# Patient Record
Sex: Female | Born: 1980 | Race: Black or African American | Hispanic: No | Marital: Married | Smoking: Former smoker
Health system: Southern US, Community
[De-identification: ages and names within clinical notes are randomized; demographics above are authoritative.]

## PROBLEM LIST (undated history)

## (undated) DIAGNOSIS — E079 Disorder of thyroid, unspecified: Secondary | ICD-10-CM

## (undated) DIAGNOSIS — M25562 Pain in left knee: Secondary | ICD-10-CM

## (undated) DIAGNOSIS — M25561 Pain in right knee: Secondary | ICD-10-CM

## (undated) DIAGNOSIS — F419 Anxiety disorder, unspecified: Secondary | ICD-10-CM

## (undated) DIAGNOSIS — G8929 Other chronic pain: Secondary | ICD-10-CM

---

## 2005-01-22 HISTORY — PX: THYROIDECTOMY: SHX17

## 2005-10-02 ENCOUNTER — Ambulatory Visit: Payer: Self-pay | Admitting: Family Medicine

## 2005-10-05 ENCOUNTER — Other Ambulatory Visit: Admission: RE | Admit: 2005-10-05 | Discharge: 2005-10-05 | Payer: Self-pay | Admitting: Obstetrics and Gynecology

## 2005-11-12 ENCOUNTER — Encounter: Admission: RE | Admit: 2005-11-12 | Discharge: 2005-11-12 | Payer: Self-pay | Admitting: Endocrinology

## 2005-11-12 ENCOUNTER — Other Ambulatory Visit: Admission: RE | Admit: 2005-11-12 | Discharge: 2005-11-12 | Payer: Self-pay | Admitting: Interventional Radiology

## 2011-06-12 ENCOUNTER — Emergency Department (HOSPITAL_COMMUNITY)
Admission: EM | Admit: 2011-06-12 | Discharge: 2011-06-12 | Disposition: A | Discharge status: Home / Self Care | Payer: Self-pay | Attending: Emergency Medicine | Admitting: Emergency Medicine

## 2011-06-12 ENCOUNTER — Encounter (HOSPITAL_COMMUNITY): Payer: Self-pay | Admitting: Emergency Medicine

## 2011-06-12 ENCOUNTER — Emergency Department (HOSPITAL_COMMUNITY): Payer: Self-pay

## 2011-06-12 DIAGNOSIS — M25539 Pain in unspecified wrist: Secondary | ICD-10-CM | POA: Insufficient documentation

## 2011-06-12 DIAGNOSIS — F17200 Nicotine dependence, unspecified, uncomplicated: Secondary | ICD-10-CM | POA: Insufficient documentation

## 2011-06-12 DIAGNOSIS — M6740 Ganglion, unspecified site: Secondary | ICD-10-CM | POA: Insufficient documentation

## 2011-06-12 DIAGNOSIS — M67439 Ganglion, unspecified wrist: Secondary | ICD-10-CM

## 2011-06-12 MED ORDER — NAPROXEN 500 MG PO TABS: 500.0000 mg | ORAL_TABLET | Freq: Two times a day (BID) | ORAL | Status: DC

## 2011-06-12 MED ORDER — TRAMADOL HCL 50 MG PO TABS: 50.0000 mg | ORAL_TABLET | Freq: Four times a day (QID) | ORAL | Status: AC | PRN

## 2011-06-12 MED ORDER — NAPROXEN 500 MG PO TABS
500.0000 mg | ORAL_TABLET | Freq: Once | ORAL | Status: AC
  Administered 2011-06-12: 500 mg via ORAL
  Filled 2011-06-12: qty 1

## 2011-06-12 NOTE — ED Notes (Signed)
PT reports pain unchanged. MD aware.

## 2011-06-12 NOTE — ED Notes (Signed)
PT. REPORTS RIGHT WRIST PAIN FOR 2 MONTHS , DENIES RECENT INJURY .

## 2011-06-12 NOTE — ED Notes (Signed)
PT ambulated with a  Steady gait; VSS; A&Ox3; no signs of distress; respirations even and unlabored; skin warm and dry; pt verbalizes understanding of d/c instructions.

## 2011-06-12 NOTE — ED Notes (Signed)
Patient transported to X-ray 
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
## 2011-06-12 NOTE — Discharge Instructions (Signed)
The swelling of your wrist is a ganglion cyst. Please see the reading instructions below regarding this diagnosis. Take Naprosyn twice a day, followup with the hand surgeon listed above as needed. This does not need to be cut off for removed as they usually go away by themselves.  Return immediately if her symptoms become severe such as redness, fevers, severe pain or swelling.  Ganglion A ganglion is a swelling under the skin that is filled with a thick, jelly-like substance. It is a synovial cyst. This is caused by a break (rupture) of the joint lining from the joint space. A ganglion often occurs near an area of repeated minor trauma (damage caused by an accident). Trauma may also be a repetitive movement at work or in a sport. TREATMENT  It often goes away without treatment. It may reappear later. Sometimes a ganglion may need to be surgically removed. Often they are drained and injected with a steroid. Sometimes they respond to:  Rest.   Splinting.  HOME CARE INSTRUCTIONS   Your caregiver will decide the best way of treating your ganglion. Do not try to break the ganglion yourself by pressing on it, poking it with a needle, or hitting it with a heavy object.   Use medications as directed.  SEEK MEDICAL CARE IF:   The ganglion becomes larger or more painful.   You have increased redness or swelling.   You have weakness or numbness in your hand or wrist.  MAKE SURE YOU:   Understand these instructions.   Will watch your condition.   Will get help right away if you are not doing well or get worse.  Document Released: 01/06/2000 Document Revised: 12/28/2010 Document Reviewed: 03/04/2007 [facility] Patient Information 2012 [place], [place].

## 2011-11-07 ENCOUNTER — Emergency Department (HOSPITAL_COMMUNITY)
Admission: EM | Admit: 2011-11-07 | Discharge: 2011-11-07 | Disposition: A | Discharge status: Home / Self Care | Payer: Self-pay | Attending: Emergency Medicine | Admitting: Emergency Medicine

## 2011-11-07 DIAGNOSIS — K0889 Other specified disorders of teeth and supporting structures: Secondary | ICD-10-CM

## 2011-11-07 DIAGNOSIS — K0381 Cracked tooth: Secondary | ICD-10-CM | POA: Insufficient documentation

## 2011-11-07 DIAGNOSIS — F17200 Nicotine dependence, unspecified, uncomplicated: Secondary | ICD-10-CM | POA: Insufficient documentation

## 2011-11-07 MED ORDER — OXYCODONE-ACETAMINOPHEN 5-325 MG PO TABS: ORAL_TABLET | ORAL | Status: DC

## 2011-11-07 MED ORDER — PENICILLIN V POTASSIUM 500 MG PO TABS: 1000.0000 mg | ORAL_TABLET | Freq: Two times a day (BID) | ORAL | Status: DC

## 2011-11-07 NOTE — ED Provider Notes (Signed)
History     CSN: [id number]  Arrival date & time 11/07/11  4696   First MD Initiated Contact with Patient 11/07/11 1034      Chief Complaint  Patient presents with  . Dental Pain    (Consider location/radiation/quality/duration/timing/severity/associated sxs/prior treatment) HPI  Regina Robertson is a 31 y.o. female complaining of left lower jaw tooth pain starting last night. Pain is rated at 8/10. She is afebrile. Denies nausea or vomiting.  No past medical history on file.  Past Surgical History  Procedure Date  . Thyroidectomy     No family history on file.  History  Substance Use Topics  . Smoking status: Current Every Day Smoker  . Smokeless tobacco: Not on file  . Alcohol Use: Yes    OB History    Grav Para Term Preterm Abortions TAB SAB Ect Mult Living                  Review of Systems  Constitutional: Negative for fever.  HENT: Positive for dental problem.   Respiratory: Negative for shortness of breath.   Cardiovascular: Negative for chest pain.  Gastrointestinal: Negative for nausea, vomiting, abdominal pain and diarrhea.  All other systems reviewed and are negative.    Allergies  Review of patient's allergies indicates no known allergies.  Home Medications  No current outpatient prescriptions on file.  BP 134/87  Pulse 84  Temp 98.9 F (37.2 C) (Oral)  Resp 16  SpO2 98%  LMP 10/14/2011  Physical Exam  Nursing note and vitals reviewed. Constitutional: She is oriented to person, place, and time. She appears well-developed and well-nourished. No distress.  HENT:  Head: Normocephalic.  Mouth/Throat: Oropharynx is clear and moist.       Cracked tooth to left lower jaw. No signs of active infection  Eyes: Conjunctivae normal and EOM are normal. Pupils are equal, round, and reactive to light.  Cardiovascular: Normal rate.   Pulmonary/Chest: Effort normal. No stridor.  Musculoskeletal: Normal range of motion.  Neurological: She is  alert and oriented to person, place, and time.  Psychiatric: She has a normal mood and affect.    ED Course  Procedures (including critical care time)  Labs Reviewed - No data to display No results found.   1. Pain, dental       MDM  Uncomplicated dental pain I will give her penicillin preventatively. Have given her dental followup  New Prescriptions   OXYCODONE-ACETAMINOPHEN (PERCOCET/ROXICET) 5-325 MG PER TABLET    1 to 2 tabs PO q6hrs  PRN for pain   PENICILLIN V POTASSIUM (VEETID) 500 MG TABLET    Take 2 tablets (1,000 mg total) by mouth 2 (two) times daily. X 7 days          Wynetta Emery, PA-C 11/07/11 1230

## 2011-11-07 NOTE — ED Notes (Addendum)
Pt c/o 10/10 pain at lower back molar on left side of mouth. Tooth appears broken, no carries or swelling visible, airway intact. Pt A&Ox4. Pt ambulates independently. Pt states she has no insurance and has not seen dentist in 2 years.

## 2011-11-07 NOTE — ED Provider Notes (Signed)
Medical screening examination/treatment/procedure(s) were performed by non-physician practitioner and as supervising physician I was immediately available for consultation/collaboration.  Lennon Boutwell, MD 11/07/11 1654 

## 2011-11-07 NOTE — ED Notes (Signed)
Per pt woke up with left sided dental pain and swelling.

## 2013-03-24 ENCOUNTER — Emergency Department (HOSPITAL_COMMUNITY)
Admission: EM | Admit: 2013-03-24 | Discharge: 2013-03-24 | Disposition: A | Discharge status: Home / Self Care | Payer: Self-pay | Attending: Emergency Medicine | Admitting: Emergency Medicine

## 2013-03-24 ENCOUNTER — Encounter (HOSPITAL_COMMUNITY): Payer: Self-pay | Admitting: Emergency Medicine

## 2013-03-24 DIAGNOSIS — M25569 Pain in unspecified knee: Secondary | ICD-10-CM | POA: Insufficient documentation

## 2013-03-24 DIAGNOSIS — M545 Low back pain, unspecified: Secondary | ICD-10-CM | POA: Insufficient documentation

## 2013-03-24 DIAGNOSIS — G8929 Other chronic pain: Secondary | ICD-10-CM

## 2013-03-24 DIAGNOSIS — Z792 Long term (current) use of antibiotics: Secondary | ICD-10-CM | POA: Insufficient documentation

## 2013-03-24 DIAGNOSIS — Z87891 Personal history of nicotine dependence: Secondary | ICD-10-CM | POA: Insufficient documentation

## 2013-03-24 DIAGNOSIS — G8921 Chronic pain due to trauma: Secondary | ICD-10-CM | POA: Insufficient documentation

## 2013-03-24 MED ORDER — CYCLOBENZAPRINE HCL 10 MG PO TABS: 10.0000 mg | ORAL_TABLET | Freq: Two times a day (BID) | ORAL | Status: DC | PRN

## 2013-03-24 MED ORDER — IBUPROFEN 800 MG PO TABS: 800.0000 mg | ORAL_TABLET | Freq: Three times a day (TID) | ORAL | Status: DC

## 2013-03-24 NOTE — ED Provider Notes (Signed)
Medical screening examination/treatment/procedure(s) were performed by non-physician practitioner and as supervising physician I was immediately available for consultation/collaboration.   Toy BakerAnthony T Jyquan Kenley, MD 03/24/13 [phone]

## 2013-03-24 NOTE — Discharge Instructions (Signed)
Arthralgia  Arthralgia is joint pain. A joint is a place where two bones meet. Joint pain can happen for many reasons. The joint can be bruised, stiff, infected, or weak from aging. Pain usually goes away after resting and taking medicine for soreness.   HOME CARE  · Rest the joint as told by your doctor.  · Keep the sore joint raised (elevated) for the first 24 hours.  · Put ice on the joint area.  · Put ice in a plastic bag.  · Place a towel between your skin and the bag.  · Leave the ice on for 15-20 minutes, 03-04 times a day.  · Wear your splint, casting, elastic bandage, or sling as told by your doctor.  · Only take medicine as told by your doctor. Do not take aspirin.  · Use crutches as told by your doctor. Do not put weight on the joint until told to by your doctor.  GET HELP RIGHT AWAY IF:   · You have bruising, puffiness (swelling), or more pain.  · Your fingers or toes turn blue or start to lose feeling (numb).  · Your medicine does not lessen the pain.  · Your pain becomes severe.  · You have a temperature by mouth above 102° F (38.9° C), not controlled by medicine.  · You cannot move or use the joint.  MAKE SURE YOU:   · Understand these instructions.  · Will watch your condition.  · Will get help right away if you are not doing well or get worse.  Document Released: 12/27/2008 Document Revised: 04/02/2011 Document Reviewed: 12/27/2008  [employer]® Patient Information ©2014 [employer].

## 2013-03-24 NOTE — ED Notes (Signed)
Pt stated that she fell in December. Both knees continue to cause pain, l/knee - unable to bear weight. C/o spasm sensation in l/knee. Unable to be seen by ortho and complete MRI, as recommended, due to financial concerns. Pt stated that she was seen in [place] twice, and treated for acute pain.

## 2013-03-24 NOTE — ED Notes (Signed)
Pt was very upset that the prescriptions that were presented to her were not a narcotic. Pt threw prescriptions in the trash can. Retreived by RN and shredded Pt needed to go to cancer center to be with her father. Pt was was escorted to cancer center ,via wheelchair, by this RN. Pt was very upset that "nothing was done"today. Pt threw discharge/follow up instructions in the trash. Retrieved by this RN. Pt reminded that her referral was on the paper. Pt asked for her papers back.

## 2013-03-24 NOTE — ED Provider Notes (Signed)
CSN: [id number]     Arrival date & time 03/24/13  1202 History  This chart was scribed for Regina Anis, PA working with Toy Baker, MD by Quintella Reichert, ED Scribe. This patient was seen in room WTR6/WTR6 and the patient's care was started at 12:21 PM.   Chief Complaint  Patient presents with  . Knee Pain    The history is provided by the patient. No language interpreter was used.    HPI Comments: Regina Robertson is a 33 y.o. female who presents to the Emergency Department complaining of 3 months of bilateral knee pain.  Pt reports that her pain began on 12/23/12 after she slipped and fell with onto both knees.  She denies injury to any other area in that fall.  Shortly after the fall she was seen for her knee pain at [facility] ED in [place] and received x-rays.  She was diagnosed with bilateral knee sprains and advised to use RICE treatment and muscle relaxants.  She was also given knee immobilizers and crutches and referred to an orthopedist for an MRI, but she could not afford this because she does not have insurance.  She reports that her pain has persisted unchanged in her left knee and has continued to wax and wane in her right knee.   Pain is worsened by bearing weight.  She has also developed "spasming" in her right knee.  She denies any repeat injuries.     Pt also notes some left-sided lower back pain radiating down her left leg.  She admits to a previous injury to her back.   History reviewed. No pertinent past medical history.   Past Surgical History  Procedure Laterality Date  . Thyroidectomy      Family History  Problem Relation Age of Onset  . Diabetes Mother   . Cancer Father   . Diabetes Father   . Hypertension Father      History  Substance Use Topics  . Smoking status: Former Games developer  . Smokeless tobacco: Not on file  . Alcohol Use: Yes    OB History   Grav Para Term Preterm Abortions TAB SAB Ect Mult Living                   Review  of Systems  Musculoskeletal: Positive for arthralgias (bilateral knee pain) and back pain (left lower back pain).  All other systems reviewed and are negative.      Allergies  Review of patient's allergies indicates no known allergies.  Home Medications   Current Outpatient Rx  Name  Route  Sig  Dispense  Refill  . oxyCODONE-acetaminophen (PERCOCET/ROXICET) 5-325 MG per tablet      1 to 2 tabs PO q6hrs  PRN for pain   15 tablet   0   . penicillin v potassium (VEETID) 500 MG tablet   Oral   Take 2 tablets (1,000 mg total) by mouth 2 (two) times daily. X 7 days   28 tablet   0    BP 140/75  Pulse 85  Temp(Src) 98.9 F (37.2 C) (Oral)  Resp 18  Wt 180 lb (81.647 kg)  SpO2 100%  LMP 03/10/2013  Physical Exam  Nursing note and vitals reviewed. Constitutional: She is oriented to person, place, and time. She appears well-developed and well-nourished. No distress.  HENT:  Head: Normocephalic and atraumatic.  Eyes: EOM are normal.  Neck: Neck supple. No tracheal deviation present.  Cardiovascular: Normal rate.   Pulmonary/Chest: Effort  normal. No respiratory distress.  Musculoskeletal:       Lumbar back: She exhibits tenderness. She exhibits no swelling.  Midline lower lumbar tenderness to palpation.  No lumbar swelling. Left knee with mild anterior swelling.  No discoloration, redness or warmth.  Joint is stable.  Painful with active and passive ROM.  Neurological: She is alert and oriented to person, place, and time.  Skin: Skin is warm and dry.  Psychiatric: She has a normal mood and affect. Her behavior is normal.    ED Course  Procedures (including critical care time)  DIAGNOSTIC STUDIES: Oxygen Saturation is 100% on room air, normal by my interpretation.    COORDINATION OF CARE: 12:32 PM-Informed pt that she will not receive an MRI in the ED due to the non-emergent nature of her condition, and that orthopedic f/u will be necessary for further evaluation and  treatment including MRI.  Pt agreed to plan.     Labs Review Labs Reviewed - No data to display  Imaging Review No results found.   EKG Interpretation None      MDM   Final diagnoses:  None   1. Chronic knee pain  Discussed that evaluation in the emergency room for chronic issues was limited and encouraged outpatient follow up with orthopedics. Patient voiced her dissatisfaction with evaluation. Provided crutches for comfort - she reports she has a knee immobilizer at home (not wearing for ambulation). She declines anti-inflammatory medication and Flexeril for spasm, "these don't work for me" and requests Tramadol. She was informed that no narcotic medication would be given for chronic pain and again encouraged to see orthopedics.     I personally performed the services described in this documentation, which was scribed in my presence. The recorded information has been reviewed and is accurate.     Arnoldo HookerShari A Michelle Wnek, PA-C 03/24/13 1401

## 2013-04-29 ENCOUNTER — Emergency Department (HOSPITAL_COMMUNITY)
Admission: EM | Admit: 2013-04-29 | Discharge: 2013-04-29 | Disposition: A | Discharge status: Home / Self Care | Payer: Self-pay | Attending: Emergency Medicine | Admitting: Emergency Medicine

## 2013-04-29 ENCOUNTER — Encounter (HOSPITAL_COMMUNITY): Payer: Self-pay | Admitting: Emergency Medicine

## 2013-04-29 DIAGNOSIS — R259 Unspecified abnormal involuntary movements: Secondary | ICD-10-CM | POA: Insufficient documentation

## 2013-04-29 DIAGNOSIS — G8929 Other chronic pain: Secondary | ICD-10-CM | POA: Insufficient documentation

## 2013-04-29 DIAGNOSIS — Z8639 Personal history of other endocrine, nutritional and metabolic disease: Secondary | ICD-10-CM | POA: Insufficient documentation

## 2013-04-29 DIAGNOSIS — Z87891 Personal history of nicotine dependence: Secondary | ICD-10-CM | POA: Insufficient documentation

## 2013-04-29 DIAGNOSIS — M25569 Pain in unspecified knee: Secondary | ICD-10-CM | POA: Insufficient documentation

## 2013-04-29 DIAGNOSIS — R251 Tremor, unspecified: Secondary | ICD-10-CM

## 2013-04-29 DIAGNOSIS — M25559 Pain in unspecified hip: Secondary | ICD-10-CM | POA: Insufficient documentation

## 2013-04-29 DIAGNOSIS — Z862 Personal history of diseases of the blood and blood-forming organs and certain disorders involving the immune mechanism: Secondary | ICD-10-CM | POA: Insufficient documentation

## 2013-04-29 DIAGNOSIS — G8911 Acute pain due to trauma: Secondary | ICD-10-CM | POA: Insufficient documentation

## 2013-04-29 HISTORY — DX: Pain in left knee: M25.562

## 2013-04-29 HISTORY — DX: Pain in right knee: M25.561

## 2013-04-29 HISTORY — DX: Anxiety disorder, unspecified: F41.9

## 2013-04-29 HISTORY — DX: Other chronic pain: G89.29

## 2013-04-29 HISTORY — DX: Disorder of thyroid, unspecified: E07.9

## 2013-04-29 LAB — BASIC METABOLIC PANEL
BUN: 11 mg/dL (ref 6–23)
CALCIUM: 10 mg/dL (ref 8.4–10.5)
CO2: 22 mEq/L (ref 19–32)
Chloride: 103 mEq/L (ref 96–112)
Creatinine, Ser: 0.78 mg/dL (ref 0.50–1.10)
Glucose, Bld: 96 mg/dL (ref 70–99)
POTASSIUM: 4.2 meq/L (ref 3.7–5.3)
SODIUM: 143 meq/L (ref 137–147)

## 2013-04-29 MED ORDER — HYDROCODONE-ACETAMINOPHEN 5-325 MG PO TABS: 1.0000 | ORAL_TABLET | ORAL | Status: DC | PRN

## 2013-04-29 MED ORDER — CLONAZEPAM 0.5 MG PO TABS: 0.5000 mg | ORAL_TABLET | Freq: Two times a day (BID) | ORAL | Status: DC | PRN

## 2013-04-29 MED ORDER — CLONAZEPAM 0.5 MG PO TABS
0.5000 mg | ORAL_TABLET | Freq: Once | ORAL | Status: AC
  Administered 2013-04-29: 0.5 mg via ORAL
  Filled 2013-04-29: qty 1

## 2013-04-29 MED ORDER — IBUPROFEN 800 MG PO TABS: 800.0000 mg | ORAL_TABLET | Freq: Three times a day (TID) | ORAL | Status: DC

## 2013-04-29 MED ORDER — LORAZEPAM 1 MG PO TABS: 2.0000 mg | ORAL_TABLET | Freq: Once | ORAL | Status: DC

## 2013-04-29 NOTE — Discharge Instructions (Signed)
Call for a follow up appointment with Dr. Roseanne RenoStewart or another Neurologist. Return if Symptoms worsen.   Take medication as prescribed.     Cryotherapy Cryotherapy means treatment with cold. Ice or gel packs can be used to reduce both pain and swelling. Ice is the most helpful within the first 24 to 48 hours after an injury or flareup from overusing a muscle or joint. Sprains, strains, spasms, burning pain, shooting pain, and aches can all be eased with ice. Ice can also be used when recovering from surgery. Ice is effective, has very few side effects, and is safe for most people to use. PRECAUTIONS  Ice is not a safe treatment option for people with:  Raynaud's phenomenon. This is a condition affecting small blood vessels in the extremities. Exposure to cold may cause your problems to return.  Cold hypersensitivity. There are many forms of cold hypersensitivity, including:  Cold urticaria. Red, itchy hives appear on the skin when the tissues begin to warm after being iced.  Cold erythema. This is a red, itchy rash caused by exposure to cold.  Cold hemoglobinuria. Red blood cells break down when the tissues begin to warm after being iced. The hemoglobin that carry oxygen are passed into the urine because they cannot combine with blood proteins fast enough.  Numbness or altered sensitivity in the area being iced. If you have any of the following conditions, do not use ice until you have discussed cryotherapy with your caregiver:  Heart conditions, such as arrhythmia, angina, or chronic heart disease.  High blood pressure.  Healing wounds or open skin in the area being iced.  Current infections.  Rheumatoid arthritis.  Poor circulation.  Diabetes. Ice slows the blood flow in the region it is applied. This is beneficial when trying to stop inflamed tissues from spreading irritating chemicals to surrounding tissues. However, if you expose your skin to cold temperatures for too long or  without the proper protection, you can damage your skin or nerves. Watch for signs of skin damage due to cold. HOME CARE INSTRUCTIONS Follow these tips to use ice and cold packs safely.  Place a dry or damp towel between the ice and skin. A damp towel will cool the skin more quickly, so you may need to shorten the time that the ice is used.  For a more rapid response, add gentle compression to the ice.  Ice for no more than 10 to 20 minutes at a time. The bonier the area you are icing, the less time it will take to get the benefits of ice.  Check your skin after 5 minutes to make sure there are no signs of a poor response to cold or skin damage.  Rest 20 minutes or more in between uses.  Once your skin is numb, you can end your treatment. You can test numbness by very lightly touching your skin. The touch should be so light that you do not see the skin dimple from the pressure of your fingertip. When using ice, most people will feel these normal sensations in this order: cold, burning, aching, and numbness.  Do not use ice on someone who cannot communicate their responses to pain, such as small children or people with dementia. HOW TO MAKE AN ICE PACK Ice packs are the most common way to use ice therapy. Other methods include ice massage, ice baths, and cryo-sprays. Muscle creams that cause a cold, tingly feeling do not offer the same benefits that ice offers and should  not be used as a substitute unless recommended by your caregiver. To make an ice pack, do one of the following:  Place crushed ice or a bag of frozen vegetables in a sealable plastic bag. Squeeze out the excess air. Place this bag inside another plastic bag. Slide the bag into a pillowcase or place a damp towel between your skin and the bag.  Mix 3 parts water with 1 part rubbing alcohol. Freeze the mixture in a sealable plastic bag. When you remove the mixture from the freezer, it will be slushy. Squeeze out the excess air.  Place this bag inside another plastic bag. Slide the bag into a pillowcase or place a damp towel between your skin and the bag. SEEK MEDICAL CARE IF:  You develop white spots on your skin. This may give the skin a blotchy (mottled) appearance.  Your skin turns blue or pale.  Your skin becomes waxy or hard.  Your swelling gets worse. MAKE SURE YOU:   Understand these instructions.  Will watch your condition.  Will get help right away if you are not doing well or get worse. Document Released: 09/04/2010 Document Revised: 04/02/2011 Document Reviewed: 09/04/2010 [facility] Patient Information 2014 [place], [place].

## 2013-04-29 NOTE — Discharge Planning (Signed)
[facility] Felicia E, [employer] Liaison  Spoke to patient about follow up care and primary care resources. Patient states she was linked with the orange card from her [facility] visit at [facility], patient states she hasn't had the time to follow up & missed her scheduled appointment Orange card application and resource guide given again. Patient was instructed to contact me once her application is complete. My contact information was provided.

## 2013-04-29 NOTE — ED Notes (Signed)
This RN visualized pt in waiting room with no leg movements.  Pt playing on cell phone.

## 2013-04-29 NOTE — ED Notes (Signed)
Pharmacy tech at bedside 

## 2013-04-29 NOTE — ED Notes (Signed)
Pt sts she was pushing her dad in a wheelchair earlier today, then they went to grocery store. While in the store pt reports "the jerking" started. Pt sts she was able to driver herself home then the jerking became worse so she called ems. Now while taking her pants off pt's right leg started moving back and forth, pt reports it feels like someone is sticking a needle in her foot. Pt sts she has had this happen to her before, starts after she has been active or doing things like pushing a wheelchair. sts she was seen for this twice in [place], told her to use ice, rest and put a brace on it, 2nd time she went they suggested for her to follow up with PCP and ortho and possibly have MRI done. Pt reports she can walk but it is very uncomfortable. Nad, skin warm and dry, resp e/u.

## 2013-04-29 NOTE — ED Provider Notes (Signed)
CSN: [id number]     Arrival date & time 04/29/13  1250 History   First MD Initiated Contact with Patient 04/29/13 1512     Chief Complaint  Patient presents with  . Leg Pain  . Hip Pain  . Tremors     (Consider location/radiation/quality/duration/timing/severity/associated sxs/prior Treatment) HPI Comments: Regina Robertson is a 33 y.o. female with a past medical history of Thyroid disease s/p partial thyroidectomy presenting the Emergency Department with a chief complaint of bilateral lower extremity cramps and involuntary movements worsening for one wee.  The patient reports fall on bilateral knees in December and was evaluated in [place], [place] and had negative XR and was told to follow up with Ortho for possible knee sprain.  She reports bilateral lower extremity cramping and involuntary movements since December. She reports worsened with ambulation.  Reports a fall due to the abnormal movement last week with injury to left knee and right hip. The patient also reports decrease in sleep due to abnormal right arm movement and jaw movement over the past 4 nights. she reports increase in stress due to being laid off, father with untreatble cancer.  She denies history of psychotic illness but reports her sister has been hospitalized several times in the past for multiple physiatric disorders.  She denies antipsychotic, depression medication or other medications other than ibuprofen.  NO PCP  Patient is a 34 y.o. female presenting with leg pain and hip pain. The history is provided by the patient and medical records. No language interpreter was used.  Leg Pain Associated symptoms: no fever   Hip Pain Associated symptoms include myalgias. Pertinent negatives include no chills, fever or headaches.    Past Medical History  Diagnosis Date  . Bilateral chronic knee pain   . Thyroid disease    Past Surgical History  Procedure Laterality Date  . Thyroidectomy     Family History  Problem Relation  Age of Onset  . Diabetes Mother   . Cancer Father   . Diabetes Father   . Hypertension Father    History  Substance Use Topics  . Smoking status: Former Games developer  . Smokeless tobacco: Not on file  . Alcohol Use: Yes   OB History   Grav Para Term Preterm Abortions TAB SAB Ect Mult Living                 Review of Systems  Constitutional: Negative for fever and chills.  Musculoskeletal: Positive for gait problem and myalgias.  Skin: Negative for color change and wound.  Neurological: Positive for tremors. Negative for light-headedness and headaches.  Psychiatric/Behavioral: Positive for sleep disturbance.      Allergies  Review of patient's allergies indicates no known allergies.  Home Medications   Current Outpatient Rx  Name  Route  Sig  Dispense  Refill  . cyclobenzaprine (FLEXERIL) 10 MG tablet   Oral   Take 1 tablet (10 mg total) by mouth 2 (two) times daily as needed for muscle spasms.   20 tablet   0   . ibuprofen (ADVIL,MOTRIN) 200 MG tablet   Oral   Take 800 mg by mouth every 6 (six) hours as needed for moderate pain.         Marland Kitchen ibuprofen (ADVIL,MOTRIN) 800 MG tablet   Oral   Take 1 tablet (800 mg total) by mouth 3 (three) times daily.   21 tablet   0    BP 144/91  Pulse 104  Temp(Src) 98.5 F (36.9 C) (Oral)  Resp 20  Ht 5\' 11"  (1.803 m)  Wt 180 lb (81.647 kg)  BMI 25.12 kg/m2  SpO2 100%  LMP 04/29/2013 Physical Exam  Nursing note and vitals reviewed. Constitutional: She is oriented to person, place, and time. She appears well-developed and well-nourished. No distress.  HENT:  Head: Normocephalic and atraumatic.  Eyes: EOM are normal. Pupils are equal, round, and reactive to light. No scleral icterus.  Neck: Neck supple.  Cardiovascular: Normal rate, regular rhythm and normal heart sounds.   No murmur heard. Pulses:      Dorsalis pedis pulses are 2+ on the right side, and 2+ on the left side.  Pulmonary/Chest: Effort normal and breath  sounds normal. She has no wheezes.  Abdominal: Soft. Bowel sounds are normal. There is no tenderness. There is no rebound and no guarding.  Musculoskeletal: Normal range of motion. She exhibits no edema.  Neurological: She is alert and oriented to person, place, and time. No sensory deficit.  Bilateral variable clonus and tremors of lower extremities, distractible. Left upper extremity with tremor with purposeful movements, distractible.  Skin: Skin is warm and dry. No rash noted.  Psychiatric: Her speech is normal and behavior is normal. Thought content normal. Her mood appears anxious.    ED Course  Procedures (including critical care time) Labs Review Results for orders placed during the hospital encounter of 04/29/13  BASIC METABOLIC PANEL      Result Value Ref Range   Sodium 143  137 - 147 mEq/L   Potassium 4.2  3.7 - 5.3 mEq/L   Chloride 103  96 - 112 mEq/L   CO2 22  19 - 32 mEq/L   Glucose, Bld 96  70 - 99 mg/dL   BUN 11  6 - 23 mg/dL   Creatinine, Ser 1.610.78  0.50 - 1.10 mg/dL   Calcium 09.610.0  8.4 - 04.510.5 mg/dL   GFR calc non Af Amer >90  >90 mL/min   GFR calc Af Amer >90  >90 mL/min   No results found.   Imaging Review No results found.   EKG Interpretation None      MDM   Final diagnoses:  Knee pain  Tremor   The patient was evaluated in the ED by [place], PA-C 03/24/2013 for similar complaints.  She has a distractible tremor bilateral lower extremities. 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 No neuro deficits on exam. BMP- without electrolyte abmormalitites Re-eval pt resting comfortably in room. The patient reports mild symptom improvement. There is a significant decrease in bilateral lower extremity movements. Discussed lab results, and treatment plan with the patient. Return precautions given. Reports understanding and no other concerns at this time.  Patient is stable for discharge at this time.  Meds given in ED:  Medications  clonazePAM (KLONOPIN) tablet 0.5  mg (0.5 mg Oral Given 04/29/13 1610)    Discharge Medication List as of 04/29/2013  5:41 PM    START taking these medications   Details  clonazePAM (KLONOPIN) 0.5 MG tablet Take 1 tablet (0.5 mg total) by mouth 2 (two) times daily as needed for anxiety., Starting 04/29/2013, Until Discontinued, Print    HYDROcodone-acetaminophen (NORCO/VICODIN) 5-325 MG per tablet Take 1 tablet by mouth every 4 (four) hours as needed., Starting 04/29/2013, Until Discontinued, Print    !! ibuprofen (ADVIL,MOTRIN) 800 MG tablet Take 1 tablet (800 mg total) by mouth 3 (three) times daily. Take with food, Starting 04/29/2013, Until Discontinued, Print     !! - Potential duplicate medications found. Please discuss  with provider.        Clabe Seal, PA-C 05/01/13 [phone]

## 2013-04-29 NOTE — ED Provider Notes (Signed)
Medical screening examination/treatment/procedure(s) were conducted as a shared visit with non-physician practitioner(s) and myself.  I personally evaluated the patient during the encounter.   EKG Interpretation None     Patient here complaining of bilateral lower extremity tremors x4 months. Symptoms are worse at night when she tries to sleep. Denies any weakness. No medications taken for this. Patient neurological exam shows distractible tremors. No deficits noted. Will check electrolytes and give patient Clonopin and give her neurology referral  Toy BakerAnthony T Tylon Kemmerling, MD 04/29/13 1605

## 2013-04-29 NOTE — ED Notes (Signed)
Pt had a fall 12/14 in which she fell on both knees.  Per chart notes, pt was [facility] [place].  Pt was d/c'd and told to follow up with orthopedist and told she needed an MRI.  Pt did not f/u d/t no insurance.  Pt seen by EDP [facility] at [facility] for [facility] pain.  Pt again instructed to f/u with orthopedist.  Pt presents today via [facility].  Pt states she had another fall about a week ago.  Pt c/o bilateral leg pain.  Pt having leg movements that EMS reported as tremors.

## 2013-05-08 NOTE — ED Provider Notes (Signed)
Medical screening examination/treatment/procedure(s) were conducted as a shared visit with non-physician practitioner(s) and myself.  I personally evaluated the patient during the encounter.   EKG Interpretation None       Toy BakerAnthony T Holley Kocurek, MD 05/08/13 1815

## 2013-05-15 ENCOUNTER — Ambulatory Visit: Payer: Self-pay | Admitting: Neurology

## 2013-05-15 ENCOUNTER — Telehealth: Payer: Self-pay | Admitting: Neurology

## 2013-05-15 NOTE — Telephone Encounter (Signed)
No show for new patient visit on 05/15/2013

## 2013-05-18 ENCOUNTER — Encounter (HOSPITAL_COMMUNITY): Payer: Self-pay | Admitting: Emergency Medicine

## 2013-05-18 ENCOUNTER — Observation Stay (HOSPITAL_COMMUNITY): Payer: Self-pay

## 2013-05-18 ENCOUNTER — Observation Stay (HOSPITAL_COMMUNITY)
Admission: EM | Admit: 2013-05-18 | Discharge: 2013-05-20 | Disposition: A | Discharge status: Home / Self Care | Payer: Self-pay | Attending: Family Medicine | Admitting: Family Medicine

## 2013-05-18 DIAGNOSIS — R251 Tremor, unspecified: Secondary | ICD-10-CM | POA: Diagnosis present

## 2013-05-18 DIAGNOSIS — M545 Low back pain, unspecified: Secondary | ICD-10-CM | POA: Insufficient documentation

## 2013-05-18 DIAGNOSIS — R259 Unspecified abnormal involuntary movements: Principal | ICD-10-CM | POA: Insufficient documentation

## 2013-05-18 DIAGNOSIS — Z5987 Material hardship due to limited financial resources, not elsewhere classified: Secondary | ICD-10-CM | POA: Insufficient documentation

## 2013-05-18 DIAGNOSIS — Z609 Problem related to social environment, unspecified: Secondary | ICD-10-CM | POA: Insufficient documentation

## 2013-05-18 DIAGNOSIS — D509 Iron deficiency anemia, unspecified: Secondary | ICD-10-CM | POA: Diagnosis present

## 2013-05-18 DIAGNOSIS — M25569 Pain in unspecified knee: Secondary | ICD-10-CM | POA: Diagnosis present

## 2013-05-18 DIAGNOSIS — E0789 Other specified disorders of thyroid: Secondary | ICD-10-CM | POA: Insufficient documentation

## 2013-05-18 DIAGNOSIS — Z598 Other problems related to housing and economic circumstances: Secondary | ICD-10-CM | POA: Insufficient documentation

## 2013-05-18 DIAGNOSIS — D649 Anemia, unspecified: Secondary | ICD-10-CM

## 2013-05-18 DIAGNOSIS — R030 Elevated blood-pressure reading, without diagnosis of hypertension: Secondary | ICD-10-CM | POA: Insufficient documentation

## 2013-05-18 DIAGNOSIS — G8929 Other chronic pain: Secondary | ICD-10-CM | POA: Insufficient documentation

## 2013-05-18 DIAGNOSIS — F332 Major depressive disorder, recurrent severe without psychotic features: Secondary | ICD-10-CM

## 2013-05-18 DIAGNOSIS — F329 Major depressive disorder, single episode, unspecified: Secondary | ICD-10-CM | POA: Insufficient documentation

## 2013-05-18 DIAGNOSIS — F411 Generalized anxiety disorder: Secondary | ICD-10-CM | POA: Insufficient documentation

## 2013-05-18 DIAGNOSIS — Z87891 Personal history of nicotine dependence: Secondary | ICD-10-CM | POA: Insufficient documentation

## 2013-05-18 LAB — PREGNANCY, URINE: Preg Test, Ur: NEGATIVE

## 2013-05-18 LAB — CBC WITH DIFFERENTIAL/PLATELET
BASOS PCT: 0 % (ref 0–1)
Basophils Absolute: 0 10*3/uL (ref 0.0–0.1)
Eosinophils Absolute: 0 10*3/uL (ref 0.0–0.7)
Eosinophils Relative: 1 % (ref 0–5)
HCT: 36.3 % (ref 36.0–46.0)
HEMOGLOBIN: 11.8 g/dL — AB (ref 12.0–15.0)
LYMPHS ABS: 2.9 10*3/uL (ref 0.7–4.0)
Lymphocytes Relative: 41 % (ref 12–46)
MCH: 25.4 pg — ABNORMAL LOW (ref 26.0–34.0)
MCHC: 32.5 g/dL (ref 30.0–36.0)
MCV: 78.1 fL (ref 78.0–100.0)
MONOS PCT: 10 % (ref 3–12)
Monocytes Absolute: 0.7 10*3/uL (ref 0.1–1.0)
NEUTROS ABS: 3.5 10*3/uL (ref 1.7–7.7)
NEUTROS PCT: 48 % (ref 43–77)
PLATELETS: 397 10*3/uL (ref 150–400)
RBC: 4.65 MIL/uL (ref 3.87–5.11)
RDW: 15.7 % — ABNORMAL HIGH (ref 11.5–15.5)
WBC: 7.2 10*3/uL (ref 4.0–10.5)

## 2013-05-18 LAB — RAPID URINE DRUG SCREEN, HOSP PERFORMED
Amphetamines: NOT DETECTED
BARBITURATES: NOT DETECTED
Benzodiazepines: NOT DETECTED
COCAINE: NOT DETECTED
Opiates: POSITIVE — AB
Tetrahydrocannabinol: NOT DETECTED

## 2013-05-18 LAB — BASIC METABOLIC PANEL
BUN: 9 mg/dL (ref 6–23)
CHLORIDE: 100 meq/L (ref 96–112)
CO2: 26 mEq/L (ref 19–32)
Calcium: 9.5 mg/dL (ref 8.4–10.5)
Creatinine, Ser: 0.58 mg/dL (ref 0.50–1.10)
Glucose, Bld: 93 mg/dL (ref 70–99)
POTASSIUM: 3.6 meq/L — AB (ref 3.7–5.3)
SODIUM: 138 meq/L (ref 137–147)

## 2013-05-18 LAB — VITAMIN B12: Vitamin B-12: 1010 pg/mL — ABNORMAL HIGH (ref 211–911)

## 2013-05-18 LAB — RETICULOCYTES
RBC.: 4.17 MIL/uL (ref 3.87–5.11)
Retic Count, Absolute: 50 10*3/uL (ref 19.0–186.0)
Retic Ct Pct: 1.2 % (ref 0.4–3.1)

## 2013-05-18 LAB — TSH: TSH: 0.898 u[IU]/mL (ref 0.350–4.500)

## 2013-05-18 LAB — FERRITIN: Ferritin: 6 ng/mL — ABNORMAL LOW (ref 10–291)

## 2013-05-18 LAB — FOLATE: FOLATE: 8 ng/mL

## 2013-05-18 LAB — T4, FREE: Free T4: 1.28 ng/dL (ref 0.80–1.80)

## 2013-05-18 MED ORDER — PRIMIDONE 50 MG PO TABS
50.0000 mg | ORAL_TABLET | Freq: Every day | ORAL | Status: DC
  Administered 2013-05-18 – 2013-05-20 (×3): 50 mg via ORAL
  Filled 2013-05-18 (×4): qty 1

## 2013-05-18 MED ORDER — ONDANSETRON HCL 4 MG/2ML IJ SOLN: 4.0000 mg | Freq: Four times a day (QID) | INTRAMUSCULAR | Status: DC | PRN

## 2013-05-18 MED ORDER — HYDROCODONE-ACETAMINOPHEN 5-325 MG PO TABS
1.0000 | ORAL_TABLET | Freq: Once | ORAL | Status: AC
Start: 2013-05-18 — End: 2013-05-18
  Administered 2013-05-18: 1 via ORAL
  Filled 2013-05-18: qty 1

## 2013-05-18 MED ORDER — ACETAMINOPHEN 650 MG RE SUPP: 650.0000 mg | Freq: Four times a day (QID) | RECTAL | Status: DC | PRN

## 2013-05-18 MED ORDER — ONDANSETRON HCL 4 MG PO TABS: 4.0000 mg | ORAL_TABLET | Freq: Four times a day (QID) | ORAL | Status: DC | PRN

## 2013-05-18 MED ORDER — ACETAMINOPHEN 325 MG PO TABS
650.0000 mg | ORAL_TABLET | Freq: Four times a day (QID) | ORAL | Status: DC | PRN
  Administered 2013-05-18: 650 mg via ORAL
  Filled 2013-05-18: qty 2

## 2013-05-18 MED ORDER — SODIUM CHLORIDE 0.9 % IV SOLN
INTRAVENOUS | Status: DC
  Administered 2013-05-18: 03:00:00 via INTRAVENOUS

## 2013-05-18 MED ORDER — HYDRALAZINE HCL 20 MG/ML IJ SOLN
10.0000 mg | INTRAMUSCULAR | Status: DC | PRN
  Filled 2013-05-18: qty 0.5

## 2013-05-18 MED ORDER — PSEUDOEPHEDRINE HCL ER 120 MG PO TB12
120.0000 mg | ORAL_TABLET | Freq: Two times a day (BID) | ORAL | Status: DC
  Administered 2013-05-18 – 2013-05-20 (×5): 120 mg via ORAL
  Filled 2013-05-18 (×6): qty 1

## 2013-05-18 MED ORDER — DIAZEPAM 5 MG/ML IJ SOLN
5.0000 mg | Freq: Once | INTRAMUSCULAR | Status: AC
  Administered 2013-05-18: 5 mg via INTRAVENOUS
  Filled 2013-05-18: qty 2

## 2013-05-18 MED ORDER — SODIUM CHLORIDE 0.9 % IJ SOLN
3.0000 mL | Freq: Two times a day (BID) | INTRAMUSCULAR | Status: DC
Start: 2013-05-18 — End: 2013-05-20
  Administered 2013-05-18 (×2): 3 mL via INTRAVENOUS

## 2013-05-18 MED ORDER — OXYCODONE HCL 5 MG PO TABS
5.0000 mg | ORAL_TABLET | Freq: Four times a day (QID) | ORAL | Status: DC | PRN
  Administered 2013-05-18 – 2013-05-20 (×9): 5 mg via ORAL
  Filled 2013-05-18 (×9): qty 1

## 2013-05-18 MED ORDER — DIAZEPAM 5 MG PO TABS
5.0000 mg | ORAL_TABLET | Freq: Once | ORAL | Status: AC
  Administered 2013-05-18: 5 mg via ORAL
  Filled 2013-05-18: qty 1

## 2013-05-18 MED ORDER — DIAZEPAM 2 MG PO TABS
2.0000 mg | ORAL_TABLET | Freq: Once | ORAL | Status: AC
  Administered 2013-05-18: 2 mg via ORAL
  Filled 2013-05-18: qty 1

## 2013-05-18 NOTE — ED Provider Notes (Signed)
CSN: [id number]     Arrival date & time 05/18/13  0051 History   First MD Initiated Contact with Patient 05/18/13 [id number]     Chief Complaint  Patient presents with  . Tremors     (Consider location/radiation/quality/duration/timing/severity/associated sxs/prior Treatment) HPI This is a 33 year old female who fell in December of last year injuring both knees. She's had persistent pain in both knees particularly the left. She has been unable to followup with orthopedics due to financial difficulties. Since the fall she has had a problem with tremors. The tremors initially were in the left lower extremity but now are occurring in her other extremities, especially her right upper extremity, as well as her jaw. The tremors are painful and she is having pain in her cocyx particularly when she lies supine; her coccyx has been hurting since her previous visit on April 8. She was treated with clonazepam in the past with some relief. She is out of clonazepam. She has not been able to followup with neurology. The tremors vary from time to time. She has had occasional paresthesias of the right side of her body. She has been seen in the ED several times for this.  Past Medical History  Diagnosis Date  . Bilateral chronic knee pain   . Thyroid disease   . Anxiety    Past Surgical History  Procedure Laterality Date  . Thyroidectomy     Family History  Problem Relation Age of Onset  . Diabetes Mother   . Cancer Father   . Diabetes Father   . Hypertension Father    History  Substance Use Topics  . Smoking status: Former Games developermoker  . Smokeless tobacco: Not on file  . Alcohol Use: Yes   OB History   Grav Para Term Preterm Abortions TAB SAB Ect Mult Living                 Review of Systems  All other systems reviewed and are negative.  Allergies  Review of patient's allergies indicates no known allergies.  Home Medications   Prior to Admission medications   Medication Sig Start Date End Date  Taking? Authorizing Provider  ibuprofen (ADVIL,MOTRIN) 200 MG tablet Take 800 mg by mouth every 6 (six) hours as needed for moderate pain.   Yes Historical Provider, MD   BP 168/100  Pulse 93  Temp(Src) 98.7 F (37.1 C) (Oral)  Resp 18  Ht 5\' 11"  (1.803 m)  Wt 196 lb 6.4 oz (89.086 kg)  BMI 27.40 kg/m2  SpO2 100%  LMP 05/01/2013  Physical Exam General: Well-developed, well-nourished female in no acute distress; appearance consistent with age of record HENT: normocephalic; atraumatic Eyes: pupils equal, round and reactive to light; extraocular muscles intact Neck: supple Heart: regular rate and rhythm Lungs: clear to auscultation bilaterally Abdomen: soft; nondistended; nontender; no masses or hepatosplenomegaly; bowel sounds present Back: pain on movement of lower back Extremities: No deformity; limited range of motion due to tremor and spasm Neurologic: Awake, alert and oriented; motor function intact in all extremities; intermittent tonic-clonic tremor with hypertonicity of extremities, notably the left lower extremity and right upper extremity with tremor of the jaw Skin: Warm and dry Psychiatric: Tearful    ED Course  Procedures (including critical care time)   MDM   Nursing notes and vitals signs, including pulse oximetry, reviewed.  Summary of this visit's results, reviewed by myself:  Labs:  Results for orders placed during the hospital encounter of 05/18/13 (from the past 24  hour(s))  CBC WITH DIFFERENTIAL     Status: Abnormal   Collection Time    05/18/13  3:12 AM      Result Value Ref Range   WBC 7.2  4.0 - 10.5 K/uL   RBC 4.65  3.87 - 5.11 MIL/uL   Hemoglobin 11.8 (*) 12.0 - 15.0 g/dL   HCT 16.136.3  09.636.0 - 04.546.0 %   MCV 78.1  78.0 - 100.0 fL   MCH 25.4 (*) 26.0 - 34.0 pg   MCHC 32.5  30.0 - 36.0 g/dL   RDW 40.915.7 (*) 81.111.5 - 91.415.5 %   Platelets 397  150 - 400 K/uL   Neutrophils Relative % 48  43 - 77 %   Neutro Abs 3.5  1.7 - 7.7 K/uL   Lymphocytes Relative  41  12 - 46 %   Lymphs Abs 2.9  0.7 - 4.0 K/uL   Monocytes Relative 10  3 - 12 %   Monocytes Absolute 0.7  0.1 - 1.0 K/uL   Eosinophils Relative 1  0 - 5 %   Eosinophils Absolute 0.0  0.0 - 0.7 K/uL   Basophils Relative 0  0 - 1 %   Basophils Absolute 0.0  0.0 - 0.1 K/uL  BASIC METABOLIC PANEL     Status: Abnormal   Collection Time    05/18/13  3:12 AM      Result Value Ref Range   Sodium 138  137 - 147 mEq/L   Potassium 3.6 (*) 3.7 - 5.3 mEq/L   Chloride 100  96 - 112 mEq/L   CO2 26  19 - 32 mEq/L   Glucose, Bld 93  70 - 99 mg/dL   BUN 9  6 - 23 mg/dL   Creatinine, Ser 7.820.58  0.50 - 1.10 mg/dL   Calcium 9.5  8.4 - 95.610.5 mg/dL   GFR calc non Af Amer >90  >90 mL/min   GFR calc Af Amer >90  >90 mL/min    Imaging Studies: Ct Head Wo Contrast  05/18/2013   CLINICAL DATA:  Tremor.  EXAM: CT HEAD WITHOUT CONTRAST  TECHNIQUE: Contiguous axial images were obtained from the base of the skull through the vertex without intravenous contrast.  COMPARISON:  None available for comparison at time of study interpretation.  FINDINGS: The ventricles and sulci are normal. No intraparenchymal hemorrhage, mass effect nor midline shift. No acute large vascular territory infarcts.  No abnormal extra-axial fluid collections. Basal cisterns are patent.  No skull fracture. The included ocular globes and orbital contents are non-suspicious. The mastoid aircells and included paranasal sinuses are well-aerated.  IMPRESSION: No acute intracranial process ; normal noncontrast CT of the head.   Electronically Signed   By: Awilda Metroourtnay  Bloomer   On: 05/18/2013 05:44    3:58 AM Patient reports equivocal improvement after IV Valium 5 mg. Patient is tremor seems to increase when she knows she is being observed and decrease when she is left alone.  5:04 AM Hospitalist to admit and neurology will consult. Will obtain CT scan.  Hanley SeamenJohn L Keyoni Lapinski, MD 05/18/13 [id number]

## 2013-05-18 NOTE — ED Notes (Signed)
Pt c/o worsening tremors and L knee pain. Tremors noted to hand and feet, pt unable to speak at times d/t jaw spasms. Pt states she has been unable to follow up with neuro and ortho, pt does not have funds.

## 2013-05-18 NOTE — Progress Notes (Signed)
Radiology called to bring patient down for lumber X-ray and patient stated she cannot go because of her Tremors, radiology tech notified. Will continue to F/U.

## 2013-05-18 NOTE — Consult Note (Signed)
Reason for Consult:Tremors Referring Physician: Jomarie LongsJoseph  CC: Tremors  HPI: Regina Robertson is an 33 y.o. female presenting with tremors.  Patient reports she has been having knee pain since she fell last December. Patient states she fell after tripping. After the fall she also started developing tremors of the lower extremities and occasionally upper extremities which are disabling. The tremor started in her LLE, went to the RLE, then the RUE and jaw.  At times the LUE is involved as well.  It has been progressively worsening.  She is unable to ambulate with out a walker or crutches.  Patient has come to the ER frequently with pain and tremors. She was not able to make any of her outpatient appointments due to financial issues.  She admits to a lot of stressors. She reports the tremors give her a feeling of impending doom.   Past Medical History  Diagnosis Date  . Bilateral chronic knee pain   . Thyroid disease   . Anxiety     Past Surgical History  Procedure Laterality Date  . Thyroidectomy      Family History  Problem Relation Age of Onset  . Diabetes Mother   . Cancer Father   . Diabetes Father   . Hypertension Father     Social History:  reports that she has quit smoking. She does not have any smokeless tobacco history on file. She reports that she drinks alcohol. She reports that she does not use illicit drugs.  No Known Allergies  Medications:  I have reviewed the patient's current medications. Prior to Admission:  Prescriptions prior to admission  Medication Sig Dispense Refill  . ibuprofen (ADVIL,MOTRIN) 200 MG tablet Take 800 mg by mouth every 6 (six) hours as needed for moderate pain.       Scheduled: . sodium chloride  3 mL Intravenous Q12H    ROS: History obtained from the patient  General ROS: negative for - chills, fatigue, fever, night sweats, weight gain or weight loss Psychological ROS: anxiety Ophthalmic ROS: negative for - blurry vision, double  vision, eye pain or loss of vision ENT ROS: negative for - epistaxis, nasal discharge, oral lesions, sore throat, tinnitus or vertigo Allergy and Immunology ROS: negative for - hives or itchy/watery eyes Hematological and Lymphatic ROS: negative for - bleeding problems, bruising or swollen lymph nodes Endocrine ROS: negative for - galactorrhea, hair pattern changes, polydipsia/polyuria or temperature intolerance Respiratory ROS: negative for - cough, hemoptysis, shortness of breath or wheezing Cardiovascular ROS: negative for - chest pain, dyspnea on exertion, edema or irregular heartbeat Gastrointestinal ROS: negative for - abdominal pain, diarrhea, hematemesis, nausea/vomiting or stool incontinence Genito-Urinary ROS: negative for - dysuria, hematuria, incontinence or urinary frequency/urgency Musculoskeletal ROS: knee pain Neurological ROS: as noted in HPI Dermatological ROS: negative for rash and skin lesion changes  Physical Examination: Blood pressure 159/94, pulse 87, temperature 98.3 F (36.8 C), temperature source Oral, resp. rate 18, height 5\' 11"  (1.803 m), weight 89.086 kg (196 lb 6.4 oz), last menstrual period 05/01/2013, SpO2 100.00%.  Neurologic Examination Mental Status: Alert, oriented, thought content appropriate.  Speech fluent without evidence of aphasia.  Able to follow 3 step commands without difficulty. Cranial Nerves: II: Discs flat bilaterally; Visual fields grossly normal, pupils equal, round, reactive to light and accommodation III,IV, VI: ptosis not present, extra-ocular motions intact bilaterally V,VII: smile symmetric, facial light touch sensation normal bilaterally VIII: hearing normal bilaterally IX,X: gag reflex present XI: bilateral shoulder shrug XII: midline tongue extension Motor:  On initial entering in the room there  Is no tremor.  As we continue to talk she begins to flex and extend her toes and reports that is is her tremor.  Her left starts as  well.  When asked to lift her left leg she has no reciprocal downward movement of her right leg but is able to give reciprocal movement of her left when the right is lifted.  She actually resists active movement of the LLE.  Gives 5/5 strength of the upper extremties.   Sensory: Pinprick and light touch intact throughout, bilaterally Deep Tendon Reflexes: 2+ and symmetric throughout Plantars: Right: downgoing   Left: downgoing Cerebellar: normal finger-to-nose testing Gait: Unable to test CV: pulses palpable throughout     Laboratory Studies:   Basic Metabolic Panel:  Recent Labs Lab 05/18/13 0312  NA 138  K 3.6*  CL 100  CO2 26  GLUCOSE 93  BUN 9  CREATININE 0.58  CALCIUM 9.5    Liver Function Tests: No results found for this basename: AST, ALT, ALKPHOS, BILITOT, PROT, ALBUMIN,  in the last 168 hours No results found for this basename: LIPASE, AMYLASE,  in the last 168 hours No results found for this basename: AMMONIA,  in the last 168 hours  CBC:  Recent Labs Lab 05/18/13 0312  WBC 7.2  NEUTROABS 3.5  HGB 11.8*  HCT 36.3  MCV 78.1  PLT 397    Cardiac Enzymes: No results found for this basename: CKTOTAL, CKMB, CKMBINDEX, TROPONINI,  in the last 168 hours  BNP: No components found with this basename: POCBNP,   CBG: No results found for this basename: GLUCAP,  in the last 168 hours  Microbiology: No results found for this or any previous visit.  Coagulation Studies: No results found for this basename: LABPROT, INR,  in the last 72 hours  Urinalysis: No results found for this basename: COLORURINE, APPERANCEUR, LABSPEC, PHURINE, GLUCOSEU, HGBUR, BILIRUBINUR, KETONESUR, PROTEINUR, UROBILINOGEN, NITRITE, LEUKOCYTESUR,  in the last 168 hours  Lipid Panel:  No results found for this basename: chol, trig, hdl, cholhdl, vldl, ldlcalc    HgbA1C:  No results found for this basename: HGBA1C    Urine Drug Screen:   No results found for this basename:  labopia, cocainscrnur, labbenz, amphetmu, thcu, labbarb    Alcohol Level: No results found for this basename: ETH,  in the last 168 hours   Imaging: Ct Head Wo Contrast  05/18/2013   CLINICAL DATA:  Tremor.  EXAM: CT HEAD WITHOUT CONTRAST  TECHNIQUE: Contiguous axial images were obtained from the base of the skull through the vertex without intravenous contrast.  COMPARISON:  None available for comparison at time of study interpretation.  FINDINGS: The ventricles and sulci are normal. No intraparenchymal hemorrhage, mass effect nor midline shift. No acute large vascular territory infarcts.  No abnormal extra-axial fluid collections. Basal cisterns are patent.  No skull fracture. The included ocular globes and orbital contents are non-suspicious. The mastoid aircells and included paranasal sinuses are well-aerated.  IMPRESSION: No acute intracranial process ; normal noncontrast CT of the head.   Electronically Signed   By: Awilda Metro   On: 05/18/2013 05:44     Assessment/Plan: 33 year old female with tremor.  History is very unusual for development of a tremor.  Neurological examination is highly suggestive of a nonphysiologic etiology.  Discussion had with patient concerning this.    Recommendations: 1.  Agree with TSH 2.  Heavy metal screen 3.  PT 4.  No further neurologic intervention is recommended at this time.  If further questions arise, please call or page at that time.  Thank you for allowing neurology to participate in the care of this patient.   Thana FarrLeslie Melroy Bougher, MD [facility] [phone] 05/18/2013, 7:22 AM

## 2013-05-18 NOTE — Evaluation (Signed)
Physical Therapy Evaluation Patient Details Name: Ihor Austinicolette Ptacek MRN: [id number] DOB: 04/20/1980 Today's Date: 05/18/2013   History of Present Illness  Nylene Leonel RamsayFelder is an 33 y.o. female presenting with tremors. Has ghad falls and uses crutches to walk due to  L knee pain.  Clinical Impression  Pt  Presents with extraneous movements of Both legs, attempted to stand x 2 from bed, Both legs  Did not sty beneath pt. Pt will benefit from PT to address problems listed. Pt currently lives on second floor apt.  Recommend OT consult. Pt currently unable to safely stand although [facility]    Follow Up Recommendations Supervision/Assistance - 24 hour;Home health PT    Equipment Recommendations       Recommendations for Other Services       Precautions / Restrictions Precautions Precautions: Fall      Mobility  Bed Mobility Overal bed mobility: Needs Assistance;+ 2 for safety/equipment Bed Mobility: Supine to Sit;Sit to Supine     Supine to sit: Mod assist;HOB elevated Sit to supine: Mod assist;HOB elevated   General bed mobility comments: use of rail, cues to roll to facilitate  sitting from side, extra time to get legs over edge and back onto bed, noted abnormal movements of feet, ankles and some quivering of quad muscles in each leg.  Transfers Overall transfer level: Needs assistance Equipment used: Rolling walker (2 wheeled) Transfers: Sit to/from Stand Sit to Stand: +2 physical assistance;+2 safety/equipment;Total assist;From elevated surface         General transfer comment: attempted x 2 to stand, approximation through knees, each leg would drift to L in a wind swept pattern, pt was unable to safely stand. At times L leg would extend forward with standing attempt. Pt not deemed safe to pursue standing. Pt reports that she can not stand as long as her legs have the trmors/movemnets  Ambulation/Gait                Stairs             Wheelchair Mobility    Modified Rankin (Stroke Patients Only)       Balance Overall balance assessment: Needs assistance;History of Falls Sitting-balance support: No upper extremity supported;Feet supported Sitting balance-Leahy Scale: Fair                                       Pertinent Vitals/Pain Reports pain in tail bone area. Pt was medicated    Home Living Family/patient expects to be discharged to:: Private residence Living Arrangements: Alone   Type of Home: Apartment Home Access: Stairs to enter Entrance Stairs-Rails: Right Entrance Stairs-Number of Steps: flight Home Layout: One level Home Equipment: Crutches      Prior Function Level of Independence: Independent with assistive device(s)               Hand Dominance        Extremity/Trunk Assessment   Upper Extremity Assessment: Generalized weakness           Lower Extremity Assessment: RLE deficits/detail;LLE deficits/detail RLE Deficits / Details: upon PT arrival, pt lying in bed. as pt began to mobilize , noted extraneous movements of ankle and leg,, tremor like. .  these movements sustained during entire session and never subsided. LLE Deficits / Details: very similar to R except noted great toe flexed  and maintained in that position, extraneous  movements sustained during session, at times excalating expecailly with attempt to stand.  Cervical / Trunk Assessment: Normal  Communication   Communication: No difficulties  Cognition Arousal/Alertness: Awake/alert Behavior During Therapy: WFL for tasks assessed/performed Overall Cognitive Status: Within Functional Limits for tasks assessed (emotional at times.)                      General Comments      Exercises        Assessment/Plan    PT Assessment Patient needs continued PT services  PT Diagnosis Difficulty walking;Generalized weakness;Acute pain   PT Problem List Decreased strength;Decreased range  of motion;Decreased activity tolerance;Decreased balance;Decreased mobility;Decreased knowledge of precautions;Decreased safety awareness;Decreased knowledge of use of DME;Decreased coordination;Impaired tone;Pain  PT Treatment Interventions DME instruction;Gait training;Stair training;Functional mobility training;Therapeutic activities;Therapeutic exercise;Patient/family education   PT Goals (Current goals can be found in the Care Plan section) Acute Rehab PT Goals Patient Stated Goal: I  want to know what is wrong. The valium  is not helping. I don't see how stress can cause my legs to do this. PT Goal Formulation: With patient Time For Goal Achievement: 05/18/13 Potential to Achieve Goals: Good    Frequency Min 3X/week   Barriers to discharge Inaccessible home environment;Decreased caregiver support      Co-evaluation               End of Session Equipment Utilized During Treatment: Gait belt Activity Tolerance: Patient limited by fatigue;Treatment limited secondary to medical complications (Comment) Patient left: in bed;with call bell/phone within reach Nurse Communication: Mobility status    Functional Assessment Tool Used: clnical judgement. Functional Limitation: Mobility: Walking and moving around Mobility: Walking and Moving Around Current Status [phone](G8978): 100 percent impaired, limited or restricted Mobility: Walking and Moving Around Goal Status [phone](G8979): At least 1 percent but less than 20 percent impaired, limited or restricted    Time: [id number] PT Time Calculation (min): 25 min   Charges:   PT Evaluation $Initial PT Evaluation Tier I: 1 Procedure PT Treatments $Therapeutic Activity: 23-37 mins   PT G Codes:   Functional Assessment Tool Used: clnical judgement. Functional Limitation: Mobility: Walking and moving around    [facility] ChildrenKaren Elizabeth Randy Castrejon 05/18/2013, 2:41 PM Blanchard KelchKaren Jaidalyn Schillo PT [phone]

## 2013-05-18 NOTE — ED Notes (Signed)
Patient requesting pain medication in addition to the Valium. Patient proceeds to states she took her father's pain medication at home, states she took 1/2 of an extended release Morphine tablet and another unknown immediate release pain medication that did not help.

## 2013-05-18 NOTE — Progress Notes (Signed)
UR Completed.  Doraine Schexnider Jane Johnnie Goynes [phone] 05/18/2013  

## 2013-05-18 NOTE — Progress Notes (Signed)
Pt requesting something else for tremor. Pt feel Primidone not helping. Also, pt pulled out IV. Pt stated "it's aggravating me and I don't under stand why I have it." Writer explained to pt the reason for the IV and pt not agreeable to replacing IV at this time.  Donnamarie PoagK. Kirby, NP text paged. Awaiting return call. Will continue to monitor.
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
## 2013-05-18 NOTE — Progress Notes (Addendum)
Pt seen and examined, admitted this am with severe L knee pain and tremors. Seen by Neuro for tremors, suspect Functional disorder, given behaviour and normal exam Await TSH./T4 and heavy metal screen, also check B12 Agree with MRI of L knee Pt eval May need Fu with Nena Jordanrtho  Jedadiah Abdallah, MD [phone]

## 2013-05-18 NOTE — ED Notes (Signed)
Patient sitting on the side of the bed, side rail down, legs extended to the floor stating that she "just can't take this, I need pain medicine". Advised patient I would update the MD. Dr Read DriversMolpus notified. No new orders at this time.

## 2013-05-18 NOTE — ED Notes (Addendum)
Entered patient room to start IV. Patient is sitting upright in the bed, legs crossed, no tremors noted.

## 2013-05-18 NOTE — H&P (Signed)
[facility] History and Physical  Regina Austinicolette Stell ZOX:[id number] DOB: 04/15/1980 DOA: 05/18/2013  Referring physician: ER physician. PCP: Default, Provider, MD   Chief Complaint: Tremors and left knee pain.  HPI: Regina Robertson is a 33 y.o. female with no significant past medical history presents to the ER because of increasing lower extremity tremors and left knee pain. Patient states she has been having knee pain since she fell last December. Patient states she fell after tripping. After the fall she also started developing tremors of the lower extremities and occasionally upper extremities which are disabling. She has had recently a fall which has cause pain in the low back. Patient had come to the ER every week silver with pain and tremors. She was not able to follow with neurologist or orthopedics due to financial issues. In the ER patient was found to have tremors and patient states that this was disabling her. On-call neurologist Dr. Thad Rangereynolds was consulted by ER physician at this time Dr. Thad Rangereynolds has said that neurology will be seeing patient in consult and patient will be admitted for observation. Patient otherwise denies any chest pain or short of breath nausea vomiting headache fever chills any incontinence of urine or bowel. Patient occasionally has tremors of the right upper extremity. Denies any visual symptoms difficulty speaking or swallowing. Patient states she has taken some of her father's morphine due to pain.  Review of Systems: As presented in the history of presenting illness, rest negative.  Past Medical History  Diagnosis Date  . Bilateral chronic knee pain   . Thyroid disease   . Anxiety    Past Surgical History  Procedure Laterality Date  . Thyroidectomy     Social History:  reports that she has quit smoking. She does not have any smokeless tobacco history on file. She reports that she drinks alcohol. She reports that she does not use illicit drugs. Where  does patient live home. Can patient participate in ADLs? Yes.  No Known Allergies  Family History:  Family History  Problem Relation Age of Onset  . Diabetes Mother   . Cancer Father   . Diabetes Father   . Hypertension Father       Prior to Admission medications   Medication Sig Start Date End Date Taking? Authorizing Provider  ibuprofen (ADVIL,MOTRIN) 200 MG tablet Take 800 mg by mouth every 6 (six) hours as needed for moderate pain.   Yes Historical Provider, MD    Physical Exam: Filed Vitals:   05/18/13 0057 05/18/13 0555  BP: 167/89 168/100  Pulse: 92 93  Temp: 99 F (37.2 C) 98.7 F (37.1 C)  TempSrc: Oral Oral  Resp: 18 18  SpO2: 100% 100%     General:  Well-developed and nourished.  Eyes: Anicteric no pallor.  ENT: No discharge from the ears eyes nose mouth.  Neck: No mass felt.  Cardiovascular: S1-S2 heard.  Respiratory: No rhonchi or crepitations.  Abdomen: Soft nontender bowel sounds present. No guarding or rigidity.  Skin: No rash.  Musculoskeletal: Pain on moving both knees more on the left side. No obvious effusion.  Psychiatric: Appears normal.  Neurologic: Alert. Oriented to time place and person. Moves all extremities 5 x 5. Has coarse tremors on the lower extremities.  Labs on Admission:  Basic Metabolic Panel:  Recent Labs Lab 05/18/13 0312  NA 138  K 3.6*  CL 100  CO2 26  GLUCOSE 93  BUN 9  CREATININE 0.58  CALCIUM 9.5   Liver Function Tests:  No results found for this basename: AST, ALT, ALKPHOS, BILITOT, PROT, ALBUMIN,  in the last 168 hours No results found for this basename: LIPASE, AMYLASE,  in the last 168 hours No results found for this basename: AMMONIA,  in the last 168 hours CBC:  Recent Labs Lab 05/18/13 0312  WBC 7.2  NEUTROABS 3.5  HGB 11.8*  HCT 36.3  MCV 78.1  PLT 397   Cardiac Enzymes: No results found for this basename: CKTOTAL, CKMB, CKMBINDEX, TROPONINI,  in the last 168 hours  BNP (last 3  results) No results found for this basename: PROBNP,  in the last 8760 hours CBG: No results found for this basename: GLUCAP,  in the last 168 hours  Radiological Exams on Admission: Ct Head Wo Contrast  05/18/2013   CLINICAL DATA:  Tremor.  EXAM: CT HEAD WITHOUT CONTRAST  TECHNIQUE: Contiguous axial images were obtained from the base of the skull through the vertex without intravenous contrast.  COMPARISON:  None available for comparison at time of study interpretation.  FINDINGS: The ventricles and sulci are normal. No intraparenchymal hemorrhage, mass effect nor midline shift. No acute large vascular territory infarcts.  No abnormal extra-axial fluid collections. Basal cisterns are patent.  No skull fracture. The included ocular globes and orbital contents are non-suspicious. The mastoid aircells and included paranasal sinuses are well-aerated.  IMPRESSION: No acute intracranial process ; normal noncontrast CT of the head.   Electronically Signed   By: Awilda Metroourtnay  Bloomer   On: 05/18/2013 05:44     Assessment/Plan Active Problems:   Tremor   Anemia   Knee pain   Tremors of nervous system   1. Tremors - patient's CT head was unremarkable. Neurologist to see patient in further recommendations per neurologist. Physical therapy has been consulted. 2. Knee pain and low back pain - patient's knee pain is more left-sided and patient states that she has had previous x-rays which were negative for any fracture. Have ordered MRI left knee. When necessary pain medications. 3. Anemia - check anemia panel. 4. Elevated blood pressure - for now I have placed patient on when necessary IV hydralazine for systolic blood pressure more than 160.    Code Status: Full code.  Family Communication: None.  Disposition Plan: Admit for observation.    Eduard ClosArshad N Jayleigh Notarianni [facility] Pager [phone].  If 7PM-7AM, please contact night-coverage [email or web address] Password [facility] 05/18/2013, 6:04  AM

## 2013-05-19 DIAGNOSIS — G8929 Other chronic pain: Secondary | ICD-10-CM

## 2013-05-19 DIAGNOSIS — F332 Major depressive disorder, recurrent severe without psychotic features: Secondary | ICD-10-CM

## 2013-05-19 LAB — CBC
HCT: 33.3 % — ABNORMAL LOW (ref 36.0–46.0)
HEMOGLOBIN: 10.6 g/dL — AB (ref 12.0–15.0)
MCH: 24.8 pg — ABNORMAL LOW (ref 26.0–34.0)
MCHC: 31.8 g/dL (ref 30.0–36.0)
MCV: 78 fL (ref 78.0–100.0)
Platelets: 320 10*3/uL (ref 150–400)
RBC: 4.27 MIL/uL (ref 3.87–5.11)
RDW: 15.9 % — ABNORMAL HIGH (ref 11.5–15.5)
WBC: 6.1 10*3/uL (ref 4.0–10.5)

## 2013-05-19 LAB — BASIC METABOLIC PANEL
BUN: 10 mg/dL (ref 6–23)
CALCIUM: 9.2 mg/dL (ref 8.4–10.5)
CO2: 24 meq/L (ref 19–32)
Chloride: 102 mEq/L (ref 96–112)
Creatinine, Ser: 0.64 mg/dL (ref 0.50–1.10)
GFR calc Af Amer: >90 mL/min (ref >90)
GFR calc non Af Amer: >90 mL/min (ref >90)
Glucose, Bld: 100 mg/dL — ABNORMAL HIGH (ref 70–99)
POTASSIUM: 4 meq/L (ref 3.7–5.3)
SODIUM: 138 meq/L (ref 137–147)

## 2013-05-19 LAB — IRON AND TIBC
IRON: 28 ug/dL — AB (ref 42–135)
Saturation Ratios: 7 % — ABNORMAL LOW (ref 20–55)
TIBC: 426 ug/dL (ref 250–470)
UIBC: 398 ug/dL (ref 125–400)

## 2013-05-19 MED ORDER — OXYCODONE HCL 5 MG PO TABS: 5.0000 mg | ORAL_TABLET | Freq: Four times a day (QID) | ORAL | Status: DC | PRN

## 2013-05-19 MED ORDER — CLONAZEPAM 1 MG PO TABS
1.0000 mg | ORAL_TABLET | Freq: Two times a day (BID) | ORAL | Status: DC
  Administered 2013-05-19 – 2013-05-20 (×3): 1 mg via ORAL
  Filled 2013-05-19 (×3): qty 1

## 2013-05-19 MED ORDER — CLONAZEPAM 1 MG PO TABS: 1.0000 mg | ORAL_TABLET | Freq: Two times a day (BID) | ORAL | Status: DC

## 2013-05-19 NOTE — Progress Notes (Signed)
Clinical Social Work  CSW contacted the following facilities re: placement:  [facility]- no available beds  [facility]  [facility] no available beds and encouraged CSW to follow up tomorrow  [facility]- [facility]  [facility]. [facility]  [facility] but hospital encouraged CSW to send referral. Referral faxed.  CSW will continue to follow and assist with placement.  [place] Tristian Sickinger, [place]CS[place] [phone]

## 2013-05-19 NOTE — Progress Notes (Signed)
Physical Therapy Treatment Patient Details Name: Ihor Austinicolette Hutmacher MRN: [id number] DOB: 08/11/1980 Today's Date: 05/19/2013    History of Present Illness Tanikka Leonel RamsayFelder is an 33 y.o. female presenting with tremors. Has had falls and uses crutches to walk due to  L knee pain.    PT Comments    Pt c/o inability to amb due to involuntary B LE tremors.  + 2 assist for safety assisted pt OOB to amb in hallway.    Follow Up Recommendations  Supervision/Assistance - 24 hour;Home health PT     Equipment Recommendations       Recommendations for Other Services       Precautions / Restrictions Precautions Precautions: Fall Precaution Comments: B LE tremors Restrictions Weight Bearing Restrictions: No    Mobility  Bed Mobility Overal bed mobility: Needs Assistance Bed Mobility: Supine to Sit     Supine to sit: Supervision Sit to supine: Supervision   General bed mobility comments: increased time to self motivate.  Use of rails. Difficulty self scooting.    Transfers Overall transfer level: Needs assistance Equipment used: Rolling walker (2 wheeled) Transfers: Sit to/from Stand Sit to Stand: +2 physical assistance;+2 safety/equipment;Mod assist         General transfer comment: + 2 for safety assisted pt tostanding with 50% VC's on proper hand placement and 100% encouragement to increase self assist.  Ambulation/Gait Ambulation/Gait assistance: +2 safety/equipment;+2 physical assistance;Max assist;Total assist Ambulation Distance (Feet): 45 Feet Assistive device: Rolling walker (2 wheeled) Gait Pattern/deviations: Step-to pattern;Step-through pattern;Shuffle;Ataxic;Narrow base of support;Leaning posteriorly;Decreased step length - right;Decreased step length - left Gait velocity: decreased   General Gait Details: very unsteady shaky gait with inconsistant tremors along with rigidity.  Severe posterior lean/pushing requiring + 2 total assist to progress gait distance.   Tight B hip and knee extension with no indication of any L knee pain and no favoring/equal weight bearing B LE's.  MAX tremors (unknown etiology) B LE throughout gait.    Stairs            Wheelchair Mobility    Modified Rankin (Stroke Patients Only)       Balance                                    Cognition                            Exercises      General Comments        Pertinent Vitals/Pain     Home Living                      Prior Function            PT Goals (current goals can now be found in the care plan section) Progress towards PT goals: Progressing toward goals    Frequency  Min 3X/week    PT Plan      Co-evaluation             End of Session Equipment Utilized During Treatment: Gait belt Activity Tolerance: Other (comment) Patient left: in chair;with call bell/phone within reach     Time: 1005-1030 PT Time Calculation (min): 25 min  Charges:  $Gait Training: 8-22 mins $Therapeutic Activity: 8-22 mins  G Codes:      Rica Koyanagi  PTA [facility]  Acute  Rehab Pager      [phone]

## 2013-05-19 NOTE — Evaluation (Signed)
Occupational Therapy Evaluation Patient Details Name: Regina Robertson MRN: [id number] DOB: 03/21/1980 Today's Date: 05/19/2013    History of Present Illness Regina Robertson is an 33 y.o. female presenting with tremors. Has had falls and uses crutches to walk due to  L knee pain. So far all tests have been negative except for MRI of knee (Fat pad impingement at the anterior aspect of the lateral femoral condyle)    Clinical Impression   This 33 yo female Mod I at home, however reports falls, admitted with above presents to acute OT with Bil LE tremors (intermittently), decreased strength Bil LEs, decreased sitting/standing balance all affecting pt's ability to care for herself, will benefit from acute OT with follow up at SNF. Of note pt's nurse tech has reported that she has gone in to pt's room and there has been urine in the BSC--however pt had not called for A (not sure how she did it based on how she interacted with me.    Follow Up Recommendations  SNF    Equipment Recommendations  3 in 1 bedside comode       Precautions / Restrictions Precautions Precautions: Fall Restrictions Weight Bearing Restrictions: No      Mobility Bed Mobility Overal bed mobility: Needs Assistance Bed Mobility: Supine to Sit;Sit to Supine     Supine to sit: Supervision Sit to supine: Supervision      Transfers                 General transfer comment: Attempted sit>stand x1 to fix her sheets under her (they had come off of one side of HOB), just barely able to clear her bottom off of bed    Balance Overall balance assessment: Needs assistance;History of Falls Sitting-balance support: Feet supported;No upper extremity supported Sitting balance-Leahy Scale: Fair                                      ADL Overall ADL's : Needs assistance/impaired Eating/Feeding: Independent;Sitting   Grooming: Set up;Sitting   Upper Body Bathing: Set up;Sitting   Lower Body  Bathing: Moderate assistance;Sitting/lateral leans   Upper Body Dressing : Set up;Sitting   Lower Body Dressing: Moderate assistance;Sitting/lateral leans     Toilet Transfer Details (indicate cue type and reason): Unable to do at this time due to could stand with pt safely due to tremors in both legs and both legs wanting to extend.         Functional mobility during ADLs: Supervision/safety (for bed mobility)                 Pertinent Vitals/Pain 10/10 legs and back     Hand Dominance Right   Extremity/Trunk Assessment Upper Extremity Assessment Upper Extremity Assessment: Overall WFL for tasks assessed           Communication Communication Communication: No difficulties   Cognition Arousal/Alertness: Awake/alert Behavior During Therapy: Flat affect (talks in very low, low tone)                   General Comments: Pt took increased time to get her legs off of the bed; however when I asked her to lay back down she reached down with her LUE to A her legs however she then lifted them both simultaneously back into the bed.               Home Living Family/patient expects  to be discharged to:: Private residence Living Arrangements: Alone   Type of Home: Apartment Home Access: Stairs to enter [employer] CorporationEntrance Stairs-Number of Steps: flight Entrance Stairs-Rails: Right Home Layout: One level     Bathroom Shower/Tub: Walk-in shower;Tub/shower unit   [employer] IndustriesBathroom Toilet: Standard     Home Equipment: Crutches          Prior Functioning/Environment Level of Independence: Independent with assistive device(s)             OT Diagnosis: Generalized weakness;Acute pain   OT Problem List: Decreased strength;Decreased range of motion;Decreased activity tolerance;Impaired balance (sitting and/or standing);Pain;Decreased coordination (all due to legs)   OT Treatment/Interventions: Self-care/ADL training;Patient/family education;Balance training;DME and/or AE  instruction;Therapeutic activities    OT Goals(Current goals can be found in the care plan section) Acute Rehab OT Goals Patient Stated Goal: I want my legs to stop tremoring and get a pain med that works--I have told the nurse that the one they are giving me does not help OT Goal Formulation: With patient Time For Goal Achievement: 05/26/13 Potential to Achieve Goals: Fair  OT Frequency: Min 2X/week   Barriers to D/C: Decreased caregiver support             End of Session Nurse Communication:  (NT: pt wants help getting bathed)  Activity Tolerance: Patient limited by pain (limited by Bil LE tremors) Patient left: in bed;with call bell/phone within reach   Time: 0925-0937 OT Time Calculation (min): 12 min Charges:  OT General Charges $OT Visit: 1 Procedure OT Evaluation $Initial OT Evaluation Tier I: 1 Procedure OT Treatments $Self Care/Home Management : 8-22 mins G-Codes: OT G-codes **NOT FOR INPATIENT CLASS** Functional Assessment Tool Used: Clinical observation Functional Limitation: Self care Self Care Current Status ([id number]): At least 80 percent but less than 100 percent impaired, limited or restricted Self Care Goal Status ([id number](G8988): At least 20 percent but less than 40 percent impaired, limited or restricted  Regina Robertson [phone] 05/19/2013, 11:19 AM

## 2013-05-19 NOTE — Progress Notes (Signed)
CARE MANAGEMENT NOTE 05/19/2013  Patient:  Regina Robertson,Regina Robertson   Account Number:  [id number]  Date Initiated:  05/18/2013  Documentation initiated by:  Ezekiel InaMcGIBBONEY,COOKIE  Subjective/Objective Assessment:   pt admitted with cco tremor     Action/Plan:   from home   Anticipated DC Date:  05/19/2013   Anticipated DC Plan:  HOME W HOME HEALTH SERVICES      DC Planning Services  CM consult  Follow-up appt scheduled      [facility]   Choice offered to / List presented to:  C-1 Patient        HH arranged  HH-1 RN      [facility]   Status of service:  In process, will continue to follow Medicare Important Message given?  YES (If response is "NO", the following Medicare IM given date fields will be blank) Date Medicare IM given:   Date Additional Medicare IM given:    Discharge Disposition:    Per UR Regulation:  Reviewed for med. necessity/level of care/duration of stay  If discussed at Long Length of Stay Meetings, dates discussed:    Comments:  05/19/13 MMcgibboney, RN, BSN Spoke with pt concerning discharge plans. Pt given [facility] Pt selected to go home with Home Health RN. Choice of agencies given to pt. Pt selected [facility], referral called to in house rep.  05/18/13 MMcGibboney, RN, BSN Spoke with pt concerning discharge plans, PCP, medications and needs. [facility], Brochure given to pt with appointment date 05/06 at 1030AM with Dr. Izola PriceMyers. Pt stated, "[birth year] my [birth year]".  Encouraged pt to keep appointment, explained Center to pt, with information that she can pick up medications. Pt also stated that she would go.

## 2013-05-19 NOTE — Progress Notes (Signed)
Clinical Social Work Department CLINICAL SOCIAL WORK PSYCHIATRY SERVICE LINE ASSESSMENT 05/19/2013  Patient:  Regina Robertson  Account:  [id number]  [place] Date:  05/18/2013  Clinical Social Worker:  Sindy Messing, LCSW  Date/Time:  05/19/2013 04:00 PM Referred by:  Physician  Date referred:  05/19/2013 Reason for Referral  Psychosocial assessment   Presenting Symptoms/Problems (In the person's/family's own words):   Psych consulted due to depression.   Abuse/Neglect/Trauma History (check all that apply)  Denies history   Abuse/Neglect/Trauma Comments:   Psychiatric History (check all that apply)  Outpatient treatment   Psychiatric medications:  Klonopin 1 mg   Current Mental Health Hospitalizations/Previous Mental Health History:   Patient reports that her PCP has prescribed her medication but has never been formally diagnosed with depression. Patient reports no current outpatient psych follow up but is interested.   Current provider:   None   Place and Date:   N/A   Current Medications:   Scheduled Meds:      . clonazePAM  1 mg Oral BID  . primidone  50 mg Oral Daily  . pseudoephedrine  120 mg Oral BID  . sodium chloride  3 mL Intravenous Q12H        Continuous Infusions:      PRN Meds:.acetaminophen, acetaminophen, hydrALAZINE, ondansetron (ZOFRAN) IV, ondansetron, oxyCODONE       Previous Impatient Admission/Date/Reason:   None reported   Emotional Health / Current Symptoms    Suicide/Self Harm  Suicidal ideation (ex: "I can't take any more,I wish I could disappear")   Suicide attempt in the past:   Patient reports periods of hopeless and no motivation. Patient reports passive SI but no plans and no attempts.   Other harmful behavior:   None reported   Psychotic/Dissociative Symptoms  None reported   Other Psychotic/Dissociative Symptoms:    Attention/Behavioral Symptoms  Withdrawn   Other Attention / Behavioral Symptoms:   Patient avoids eye contact  and gives minimal responses.    Cognitive Impairment  Within Normal Limits   Other Cognitive Impairment:   Patient alert and oriented.    Mood and Adjustment  Guarded    Stress, Anxiety, Trauma, Any Recent Loss/Stressor  None reported   Anxiety (frequency):   N/A   Phobia (specify):   N/A   Compulsive behavior (specify):   N/A   Obsessive behavior (specify):   N/A   Other:   N/A   Substance Abuse/Use  None   SBIRT completed (please refer for detailed history):  N  Self-reported substance use:   Patient refused to complete SBIRT and reports no substance use.   Urinary Drug Screen Completed:  N Alcohol level:   N/A    Environmental/Housing/Living Arrangement  Stable housing   Who is in the home:   Alone   Emergency contact:  [place]   Patient's Strengths and Goals (patient's own words):   Patient reports supportive family and stable housing.   Clinical Social Worker's Interpretive Summary:   CSW received referral in order to complete psychosocial assessment. CSW spoke with bedside RN prior to meeting with patient. CSW met with patient at bedside and introduced myself and explained role.    Patient laying in bed and reports she is not feeling well. CSW explained that CSW worked with psych MD and wanted to assist with DC planning. Patient currently lives at home alone and reports that family will assist as much as they can. Patient reports she has been feeling lonely  and depressed and wants some treatment.    Patient feels depressed but has never been formally diagnosed or treated. Patient reports that she spoke with MD and is agreeable to medication. Patient has been experiencing passive SI but reports no plans and is able to contract for safety. Patient reports she does not want to go back into much detail because she just discussed all of her needs with psych MD.    CSW explained psych MD recommendations for inpatient treatment.  Patient reports that she was unaware of recommendation but wants to consider her options. Patient is only agreeable to local facilities and reports that she is unsure if she needs inpatient or if she can return home. CSW explained inpatient benefits and program and patient wants to consider options but agreeable for CSW to look for bed placement.    CSW will continue to follow.   Disposition:  Inpatient referral made [facility], [employer])   [place], [place] [phone]

## 2013-05-19 NOTE — Progress Notes (Signed)
UR Completed.  Michella Detjen Jane Chaniah Cisse [phone] 05/19/2013  

## 2013-05-19 NOTE — Consult Note (Signed)
[facility] Face-to-Face Psychiatry Consult   Reason for Consult:  Depression and anxiety symptoms. Referring Physician:  Dr Dolores Patty is an 33 y.o. female. Total Time spent with patient: 20 minutes  Assessment: AXIS I:  Major Depression, single episode AXIS II:  Deferred AXIS III:   Past Medical History  Diagnosis Date  . Bilateral chronic knee pain   . Thyroid disease   . Anxiety    AXIS IV:  economic problems, housing problems, other psychosocial or environmental problems, problems related to social environment and problems with primary support group AXIS V:  41-50 serious symptoms  Plan:  Recommend psychiatric Inpatient admission when medically cleared.  Subjective:   Regina Robertson is a 33 y.o. female patient admitted with tremors and knee pain.  HPI:  Patient seen chart reviewed.  Patient was 33 year old African American female who was admitted on the medical floor because of tremors and the patient.  Psych consult was called because patient experiencing increased anxiety and severe depression.  Patient denies any previous history of psychiatric inpatient treatment but endorsed that she's been suffering from depression more than a year.  Patient told her depression started in 2001 she lost her job however her symptoms getting worse in past one year.  She endorsed almost 40 pound weight loss in one ear.  She admitted to suicidal thoughts 4 weeks ago because she was very depressed hopeless and worthless however she does not have any guts to do any planning.  Patient endorsed lately she has difficulty sleeping, she feels very isolated withdrawn and she has limited support system.  She lives by herself in section 8 housing.  She has no income.  She complained of anhedonia and crying spells.  She endorse some time her leg shakes for no reason.  She sleeps only a few hours.  Patient appears very tired and lethargic.  She endorsed crying spells, depressed mood, poor attention poor  concentration and lack of energy.  She denies any paranoia or any hallucination.  She endorse passive suicidal thoughts but no plan.  Her mother lives in [place] and sister in [place].  She has no children.  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  Patient denies any drinking or using any illegal substances.  She used to take Xanax as prescribed by Dr. Marcelline Deist however recently she has not taken any medication.  Past Psychiatric History: Past Medical History  Diagnosis Date  . Bilateral chronic knee pain   . Thyroid disease   . Anxiety     reports that she has quit smoking. She does not have any smokeless tobacco history on file. She reports that she drinks alcohol. She reports that she does not use illicit drugs. Family History  Problem Relation Age of Onset  . Diabetes Mother   . Cancer Father   . Diabetes Father   . Hypertension Father      Living Arrangements: Alone   Abuse/Neglect [facility]) Physical Abuse: Denies Verbal Abuse: Denies Sexual Abuse: Denies Allergies:  No Known Allergies  ACT Assessment Complete:  No:   Past Psychiatric History: Patient denies any previous history of psychiatric inpatient treatment or any suicidal attempt.  She used to take Xanax prescribed by her primary care physician.  She denies a history of mania psychosis but endorsed suicidal thoughts in recent months.   Place of Residence:  Lives by her herself. Marital Status:  Single Employed/Unemployed:  Unemployed Education:  [employer]  Family Supports:  Limited Objective: Blood pressure 146/85, pulse 85, temperature 98.6 F (37 C), temperature source Oral, resp. rate 20, height _0  (1.803 m), weight 196 lb 6.4 oz (89.086 kg), last menstrual period 05/01/2013, SpO2 100.00%.Body mass index is 27.4 kg/(m^2). Results for orders placed during the hospital encounter of 05/18/13 (from the past 72 hour(s))  CBC WITH DIFFERENTIAL     Status:  Abnormal   Collection Time    05/18/13  3:12 AM      Result Value Ref Range   WBC 7.2  4.0 - 10.5 K/uL   RBC 4.65  3.87 - 5.11 MIL/uL   Hemoglobin 11.8 (*) 12.0 - 15.0 g/dL   HCT 36.3  36.0 - 46.0 %   MCV 78.1  78.0 - 100.0 fL   MCH 25.4 (*) 26.0 - 34.0 pg   MCHC 32.5  30.0 - 36.0 g/dL   RDW 15.7 (*) 11.5 - 15.5 %   Platelets 397  150 - 400 K/uL   Neutrophils Relative % 48  43 - 77 %   Neutro Abs 3.5  1.7 - 7.7 K/uL   Lymphocytes Relative 41  12 - 46 %   Lymphs Abs 2.9  0.7 - 4.0 K/uL   Monocytes Relative 10  3 - 12 %   Monocytes Absolute 0.7  0.1 - 1.0 K/uL   Eosinophils Relative 1  0 - 5 %   Eosinophils Absolute 0.0  0.0 - 0.7 K/uL   Basophils Relative 0  0 - 1 %   Basophils Absolute 0.0  0.0 - 0.1 K/uL  BASIC METABOLIC PANEL     Status: Abnormal   Collection Time    05/18/13  3:12 AM      Result Value Ref Range   Sodium 138  137 - 147 mEq/L   Potassium 3.6 (*) 3.7 - 5.3 mEq/L   Chloride 100  96 - 112 mEq/L   CO2 26  19 - 32 mEq/L   Glucose, Bld 93  70 - 99 mg/dL   BUN 9  6 - 23 mg/dL   Creatinine, Ser 0.58  0.50 - 1.10 mg/dL   Calcium 9.5  8.4 - 10.5 mg/dL   GFR calc non Af Amer >90  >90 mL/min   GFR calc Af Amer >90  >90 mL/min   Comment: (NOTE)     The eGFR has been calculated using the CKD EPI equation.     This calculation has not been validated in all clinical situations.     eGFR's persistently <90 mL/min signify possible Chronic Kidney     Disease.  PREGNANCY, URINE     Status: None   Collection Time    05/18/13  6:42 AM      Result Value Ref Range   Preg Test, Ur NEGATIVE  NEGATIVE   Comment:            THE SENSITIVITY OF THIS     METHODOLOGY IS >20 mIU/mL.  URINE RAPID DRUG SCREEN (HOSP PERFORMED)     Status: Abnormal   Collection Time    05/18/13  6:42 AM      Result Value Ref Range   Opiates POSITIVE (*) NONE DETECTED   Cocaine NONE DETECTED  NONE DETECTED   Benzodiazepines NONE DETECTED  NONE DETECTED   Amphetamines NONE DETECTED  NONE DETECTED    Tetrahydrocannabinol NONE DETECTED  NONE DETECTED   Barbiturates NONE DETECTED  NONE DETECTED   Comment:  DRUG SCREEN FOR MEDICAL PURPOSES     ONLY.  IF CONFIRMATION IS NEEDED     FOR ANY PURPOSE, NOTIFY LAB     WITHIN 5 DAYS.                LOWEST DETECTABLE LIMITS     FOR URINE DRUG SCREEN     Drug Class       Cutoff (ng/mL)     Amphetamine      1000     Barbiturate      200     Benzodiazepine   710     Tricyclics       626     Opiates          300     Cocaine          300     THC              50  VITAMIN B12     Status: Abnormal   Collection Time    05/18/13  7:55 AM      Result Value Ref Range   Vitamin B-12 1010 (*) 211 - 911 pg/mL   Comment: Performed at [place]     Status: None   Collection Time    05/18/13  7:55 AM      Result Value Ref Range   Folate 8.0     Comment: (NOTE)     Reference Ranges            Deficient:       0.4 - 3.3 ng/mL            Indeterminate:   3.4 - 5.4 ng/mL            Normal:              > 5.4 ng/mL     Performed at [place] TIBC     Status: Abnormal   Collection Time    05/18/13  7:55 AM      Result Value Ref Range   Iron 28 (*) 42 - 135 ug/dL   TIBC 426  250 - 470 ug/dL   Saturation Ratios 7 (*) 20 - 55 %   UIBC 398  125 - 400 ug/dL   Comment: Performed at [place]     Status: Abnormal   Collection Time    05/18/13  7:55 AM      Result Value Ref Range   Ferritin 6 (*) 10 - 291 ng/mL   Comment: Performed at [place]     Status: None   Collection Time    05/18/13  7:55 AM      Result Value Ref Range   Retic Ct Pct 1.2  0.4 - 3.1 %   RBC. 4.17  3.87 - 5.11 MIL/uL   Retic Count, Manual 50.0  19.0 - 186.0 K/uL  T4, FREE     Status: None   Collection Time    05/18/13  7:55 AM      Result Value Ref Range   Free T4 1.28  0.80 - 1.80 ng/dL   Comment: Performed at [facility]  TSH     Status: None   Collection Time     05/18/13 11:27 AM      Result Value Ref Range   TSH 0.898  0.350 - 4.500 uIU/mL   Comment: Please note change in  reference range.     Performed at [place] PANEL     Status: Abnormal   Collection Time    05/19/13  3:32 AM      Result Value Ref Range   Sodium 138  137 - 147 mEq/L   Potassium 4.0  3.7 - 5.3 mEq/L   Chloride 102  96 - 112 mEq/L   CO2 24  19 - 32 mEq/L   Glucose, Bld 100 (*) 70 - 99 mg/dL   BUN 10  6 - 23 mg/dL   Creatinine, Ser 0.64  0.50 - 1.10 mg/dL   Calcium 9.2  8.4 - 10.5 mg/dL   GFR calc non Af Amer >90  >90 mL/min   GFR calc Af Amer >90  >90 mL/min   Comment: (NOTE)     The eGFR has been calculated using the CKD EPI equation.     This calculation has not been validated in all clinical situations.     eGFR's persistently <90 mL/min signify possible Chronic Kidney     Disease.  CBC     Status: Abnormal   Collection Time    05/19/13  3:32 AM      Result Value Ref Range   WBC 6.1  4.0 - 10.5 K/uL   RBC 4.27  3.87 - 5.11 MIL/uL   Hemoglobin 10.6 (*) 12.0 - 15.0 g/dL   HCT 33.3 (*) 36.0 - 46.0 %   MCV 78.0  78.0 - 100.0 fL   MCH 24.8 (*) 26.0 - 34.0 pg   MCHC 31.8  30.0 - 36.0 g/dL   RDW 15.9 (*) 11.5 - 15.5 %   Platelets 320  150 - 400 K/uL   Labs are reviewed.  Current Facility-Administered Medications  Medication Dose Route Frequency Provider Last Rate Last Dose  . acetaminophen (TYLENOL) tablet 650 mg  650 mg Oral Q6H PRN Rise Patience, MD   650 mg at 05/18/13 9323   Or  . acetaminophen (TYLENOL) suppository 650 mg  650 mg Rectal Q6H PRN Rise Patience, MD      . clonazePAM [facility] [place]) tablet 1 mg  1 mg Oral BID Domenic Polite, MD   1 mg at 05/19/13 1055  . hydrALAZINE (APRESOLINE) injection 10 mg  10 mg Intravenous Q4H PRN Rise Patience, MD      . ondansetron [facility]) tablet 4 mg  4 mg Oral Q6H PRN Rise Patience, MD       Or  . ondansetron [facility]) injection 4 mg  4 mg Intravenous Q6H PRN Rise Patience, MD      . oxyCODONE (Oxy IR/ROXICODONE) immediate release tablet 5 mg  5 mg Oral Q6H PRN Rise Patience, MD   5 mg at 05/19/13 0746  . primidone (MYSOLINE) tablet 50 mg  50 mg Oral Daily Domenic Polite, MD   50 mg at 05/19/13 1055  . pseudoephedrine (SUDAFED) 12 hr tablet 120 mg  120 mg Oral BID Domenic Polite, MD   120 mg at 05/19/13 1055  . sodium chloride 0.9 % injection 3 mL  3 mL Intravenous Q12H Rise Patience, MD   3 mL at 05/18/13 2112    Psychiatric Specialty Exam:     Blood pressure 146/85, pulse 85, temperature 98.6 F (37 C), temperature source Oral, resp. rate 20, height _0  (1.803 m), weight 196 lb 6.4 oz (89.086 kg), last menstrual period 05/01/2013, SpO2 100.00%.Body mass index is 27.4 kg/(m^2).  General Appearance:  Fairly Groomed and Tired  Engineer, water::  Poor  Speech:  Slow  Volume:  Decreased  Mood:  Anxious, Depressed, Dysphoric and Hopeless  Affect:  Constricted and Depressed  Thought Process:  Linear  Orientation:  Full (Time, Place, and Person)  Thought Content:  Rumination  Suicidal Thoughts:  Yes.  without intent/plan  Homicidal Thoughts:  No  Memory:  Immediate;   Fair Recent;   Fair Remote;   Fair  Judgement:  Intact  Insight:  Fair  Psychomotor Activity:  Increased and Psychomotor Retardation  Concentration:  Fair  Recall:  [employer] of Knowledge:Fair  Language: Fair  Akathisia:  No  Handed:  Right  AIMS (if indicated):     Assets:  Communication Skills Desire for Improvement Housing  Sleep:      Musculoskeletal: Strength & Muscle Tone: within normal limits Gait & Station: Patient is lying on the bed Patient leans: N/A  Treatment Plan Summary: Patient is severely depressed.  Patient needs inpatient psychiatric services. Once medically cleared, transfer to behavioral health services for inpatient treatment and stabilization.  Please call [phone] if you have any further questions.  Arlyce Harman Kaysie Michelini 05/19/2013 1:41  PM

## 2013-05-19 NOTE — Progress Notes (Addendum)
[facility] PROGRESS NOTE  Ihor AustinNicolette Hulon ZOX:[id number] DOB: 06/01/1980 DOA: 05/18/2013 PCP: Default, Provider, MD  Assessment/Plan: 1. Tremors -suspect Psychogenic -TSH/T4, B12, Ca normal -was taking alprazolam for 2 years previously and stopped this within this [place] -Seen by Neuro, suspects Psychogenic etiology -a lot of stressors, financial, job loss, dying father etc -resume Klonopin, this was given to her by EDP last month and helped a little -dont know if SNF is an option -Psych consult requested  2. L knee pain -likely sprain -MRI more c/w artifact -d/w Dr.[facility] who reviewed MRI and didn't find anything acute -supportive care -may benefit from brace  3. Anxiety/Depression -Psych to eval  4. Iron defi anemia -start PO iron at discharge  Code Status: Full Code Family Communication: none at bedside Disposition Plan: medically stable for discharge, await Psych eval   Consultants:  Neuro  Ortho-D/w Dr.Hewitt  HPI/Subjective: Still with tremors, klonopin helped in past  Objective: Filed Vitals:   05/19/13 0542  BP: 146/85  Pulse: 85  Temp: 98.6 F (37 C)  Resp: 20    Intake/Output Summary (Last 24 hours) at 05/19/13 1016 Last data filed at 05/19/13 0127  Gross per 24 hour  Intake    240 ml  Output   1300 ml  Net  -1060 ml   Filed Weights   05/18/13 0610  Weight: 89.086 kg (196 lb 6.4 oz)    Exam:   General:  AAOx3  Cardiovascular: S1S2/RRR  Respiratory: CTAB  Abdomen: soft, NT, BS present  Musculoskeletal: no edema c/c  Tremors noted on lower legs   Data Reviewed: Basic Metabolic Panel:  Recent Labs Lab 05/18/13 0312 05/19/13 0332  NA 138 138  K 3.6* 4.0  CL 100 102  CO2 26 24  GLUCOSE 93 100*  BUN 9 10  CREATININE 0.58 0.64  CALCIUM 9.5 9.2   Liver Function Tests: No results found for this basename: AST, ALT, ALKPHOS, BILITOT, PROT, ALBUMIN,  in the last 168 hours No  results found for this basename: LIPASE, AMYLASE,  in the last 168 hours No results found for this basename: AMMONIA,  in the last 168 hours CBC:  Recent Labs Lab 05/18/13 0312 05/19/13 0332  WBC 7.2 6.1  NEUTROABS 3.5  --   HGB 11.8* 10.6*  HCT 36.3 33.3*  MCV 78.1 78.0  PLT 397 320   Cardiac Enzymes: No results found for this basename: CKTOTAL, CKMB, CKMBINDEX, TROPONINI,  in the last 168 hours BNP (last 3 results) No results found for this basename: PROBNP,  in the last 8760 hours CBG: No results found for this basename: GLUCAP,  in the last 168 hours  No results found for this or any previous visit (from the past 240 hour(s)).   Studies: Dg Lumbar Spine 2-3 Views  05/19/2013   CLINICAL DATA:  Status post multiple falls.  Lower back pain.  EXAM: LUMBAR SPINE - 2-3 VIEW  COMPARISON:  None.  FINDINGS: There is no evidence of fracture or subluxation. Vertebral bodies demonstrate normal height and alignment. Intervertebral disc spaces are preserved. The visualized neural foramina are grossly unremarkable in appearance. There is incomplete fusion of the posterior spinous process of S1.  The visualized bowel gas pattern is unremarkable in appearance; air and stool are noted within the colon. The sacroiliac joints are within normal limits.  IMPRESSION: No evidence of fracture or subluxation along the lumbar spine.   Electronically Signed   By: Beryle BeamsJeffery  Chang M.D.  On: 05/19/2013 04:13   Ct Head Wo Contrast  05/18/2013   CLINICAL DATA:  Tremor.  EXAM: CT HEAD WITHOUT CONTRAST  TECHNIQUE: Contiguous axial images were obtained from the base of the skull through the vertex without intravenous contrast.  COMPARISON:  None available for comparison at time of study interpretation.  FINDINGS: The ventricles and sulci are normal. No intraparenchymal hemorrhage, mass effect nor midline shift. No acute large vascular territory infarcts.  No abnormal extra-axial fluid collections. Basal cisterns are  patent.  No skull fracture. The included ocular globes and orbital contents are non-suspicious. The mastoid aircells and included paranasal sinuses are well-aerated.  IMPRESSION: No acute intracranial process ; normal noncontrast CT of the head.   Electronically Signed   By: Awilda Metroourtnay  Bloomer   On: 05/18/2013 05:44   Mr Knee Left  Wo Contrast  05/18/2013   CLINICAL DATA:  Left knee pain since a fall in December 2014.  EXAM: MRI OF THE LEFT KNEE WITHOUT CONTRAST  TECHNIQUE: Multiplanar, multisequence MR imaging of the knee was performed. No intravenous contrast was administered.  COMPARISON:  None.  FINDINGS: MENISCI  Medial meniscus: The anterior horn and midbody are slightly extruded from the joint with meniscal tissue extending superiorly but there is no definable tear.  Lateral meniscus:  Normal.  LIGAMENTS  Cruciates:  Normal.  Collaterals:  Normal.  CARTILAGE  Patellofemoral: Tiny focal fissure in the center of the medial facet.  Medial:  Normal.  Lateral:  Normal.  Joint:  No joint effusion or loose bodies.  Popliteal Fossa:  Normal.  Extensor Mechanism: There is slight lateral patellar subluxation with edema in the superior lateral aspect of Hoffa's fat at the anterior apex of the medial femoral condyle consistent with impingement upon the fat pad, best seen on image 13 of series 2.  Bones:  Normal.  IMPRESSION: Fat pad impingement at the anterior aspect of the lateral femoral condyle as described above.  Slight extrusion of the anterior horn of the medial meniscus without a tear.   Electronically Signed   By: Geanie CooleyJim  Maxwell M.D.   On: 05/18/2013 13:52    Scheduled Meds: . clonazePAM  1 mg Oral BID  . primidone  50 mg Oral Daily  . pseudoephedrine  120 mg Oral BID  . sodium chloride  3 mL Intravenous Q12H   Continuous Infusions:  Antibiotics Given (last 72 hours)   None      Active Problems:   Tremor   Anemia   Knee pain   Tremors of nervous system    Time spent: 40min    Zannie Covereetha  Shantel Helwig  [facility] Pager [phone]. If 7PM-7AM, please contact night-coverage at [email or web address], password [email or web address] 05/19/2013, 10:16 AM  LOS: 1 day

## 2013-05-19 NOTE — Discharge Summary (Addendum)
Physician Discharge Summary  Regina Robertson WUJ:[id number] DOB: September 06, 1980 DOA: 05/18/2013  PCP: Default, Provider, MD  Admit date: 05/18/2013 Discharge date: 05/19/2013  Time spent: 45 minutes  Recommendations for Outpatient Follow-up:  1. Adult wellness Center with Dr.Myers at 10:30am 2. Psychiatry  Discharge Diagnoses:    Major Depression   Tremors   Iron deficiency Anemia   Knee pain   Anxiety  Discharge Condition: need continued PT, ambulation  Diet recommendation:regular   Filed Weights   05/18/13 0610  Weight: 89.086 kg (196 lb 6.4 oz)    History of present illness:  Regina Robertson is a 33 y.o. female with no significant past medical history presents to the ER because of increasing lower extremity tremors and left knee pain. Patient states she has been having knee pain since she fell last December. Patient states she fell after tripping. After the fall she also started developing tremors of the lower extremities and occasionally upper extremities which are disabling. She has had recently a fall which has cause pain in the low back. Patient had come to the ER every week silver with pain and tremors. She was not able to follow with neurologist or orthopedics due to financial issues. In the ER patient was found to have tremors and patient states that this was disabling her. On-call neurologist Dr. Thad Ranger was consulted by ER physician at this time Dr. Thad Ranger has said that neurology will be seeing patient in consult and patient will be admitted for observation. Patient otherwise denies any chest pain or short of breath nausea vomiting headache fever chills any incontinence of urine or bowel. Patient occasionally has tremors of the right upper extremity. Denies any visual symptoms difficulty speaking or swallowing  Hospital Course:  1. 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 -Psych consult requested, inpatient psychiatric hospitalization recommended. -Medically stable for discharge   2. L knee pain  -likely sprain  -MRI more c/w artifact  -d/w Dr[facility] who reviewed MRI and didn't find anything acute  -recommended supportive care, PT -may benefit from brace, NSAIDs PRN  3. Anxiety/Depression  -Psych to eval   4. Iron defi anemia  -needs PO iron    Consultations: pscyh Neuro  Discharge Exam: Filed Vitals:   05/19/13 1351  BP: 140/81  Pulse: 89  Temp: 98.3 F (36.8 C)  Resp: 20    General: AAOx3, uncomfortable appearing Cardiovascular: S1S2/RRR Respiratory: CTAB  Discharge Instructions You were cared for by a hospitalist during your hospital stay. If you have any questions about your discharge medications or the care you received while you were in the hospital after you are discharged, you can call the unit and asked to speak with the hospitalist on call if the hospitalist that took care of you is not available. Once you are discharged, your primary care physician will handle any further medical issues. Please note that NO REFILLS for any discharge medications will be authorized once you are discharged, as it is imperative that you return to your primary care physician (or establish a relationship with a primary care physician if you do not have one) for your aftercare needs so that they can reassess your need for medications  and monitor your lab values.  Discharge Orders   Future Appointments Provider Department Dept Phone   05/27/2013 10:30 AM [facility] [phone]   Future Orders Complete By Expires   Diet general  As directed    Increase activity slowly   As directed        Medication List         clonazePAM 1 MG tablet  Commonly known as:  KLONOPIN  Take 1 tablet (1 mg total) by mouth 2 (two) times daily.     ibuprofen 200 MG tablet  Commonly known as:  ADVIL,MOTRIN  Take 800 mg by mouth every 6 (six) hours as needed for moderate pain.     oxyCODONE 5 MG immediate release tablet  Commonly known as:  Oxy IR/ROXICODONE  Take 1 tablet (5 mg total) by mouth every 6 (six) hours as needed for moderate pain.       No Known Allergies     Follow-up Information   Follow up with [facility]     On 05/27/2013. (appointment with Dr. Izola PriceMyers on 05/06 at 10:30 AM.)    Contact information:   [address] [place] [id number] [phone]       The results of significant diagnostics from this hospitalization (including imaging, microbiology, ancillary and laboratory) are listed below for reference.    Significant Diagnostic Studies: Dg Lumbar Spine 2-3 Views  05/19/2013   CLINICAL DATA:  Status post multiple falls.  Lower back pain.  EXAM: LUMBAR SPINE - 2-3 VIEW  COMPARISON:  None.  FINDINGS: There is no evidence of fracture or subluxation. Vertebral bodies demonstrate normal height and alignment. Intervertebral disc spaces are preserved. The visualized neural foramina are grossly unremarkable in appearance. There is incomplete fusion of the posterior spinous process of S1.  The visualized bowel gas pattern is unremarkable in appearance; air and stool are noted within the colon. The sacroiliac joints are within normal limits.  IMPRESSION: No evidence of fracture or subluxation along the lumbar spine.   Electronically Signed   By: Roanna RaiderJeffery  Chang M.D.   On: 05/19/2013 04:13   Ct Head Wo Contrast  05/18/2013   CLINICAL DATA:  Tremor.  EXAM: CT HEAD WITHOUT CONTRAST  TECHNIQUE: Contiguous axial images were obtained from the base of the skull through the vertex without intravenous contrast.  COMPARISON:  None  available for comparison at time of study interpretation.  FINDINGS: The ventricles and sulci are normal. No intraparenchymal hemorrhage, mass effect nor midline shift. No acute large vascular territory infarcts.  No abnormal extra-axial fluid collections. Basal cisterns are patent.  No skull fracture. The included ocular globes and orbital contents are non-suspicious. The mastoid aircells and included paranasal sinuses are well-aerated.  IMPRESSION: No acute intracranial process ; normal noncontrast CT of the head.   Electronically Signed   By: Awilda Metroourtnay  Bloomer   On: 05/18/2013 05:44   Mr Knee Left  Wo Contrast  05/18/2013   CLINICAL DATA:  Left knee pain since a fall in December 2014.  EXAM: MRI OF THE LEFT KNEE WITHOUT CONTRAST  TECHNIQUE: Multiplanar, multisequence MR imaging of the knee was performed. No intravenous contrast was administered.  COMPARISON:  None.  FINDINGS: MENISCI  Medial meniscus: The anterior horn and midbody are slightly extruded from the joint with meniscal tissue extending superiorly but there is no definable tear.  Lateral meniscus:  Normal.  LIGAMENTS  Cruciates:  Normal.  Collaterals:  Normal.  CARTILAGE  Patellofemoral: Tiny focal fissure in the center of the medial facet.  Medial:  Normal.  Lateral:  Normal.  Joint:  No joint effusion or loose bodies.  Popliteal Fossa:  Normal.  Extensor Mechanism: There is slight lateral patellar subluxation with edema in the superior lateral aspect of Hoffa's fat at the anterior apex of the medial femoral condyle consistent with impingement upon the fat pad, best seen on image 13 of series 2.  Bones:  Normal.  IMPRESSION: Fat pad impingement at the anterior aspect of the lateral femoral condyle as described above.  Slight extrusion of the anterior horn of the medial meniscus without a tear.   Electronically Signed   By: Geanie CooleyJim  Maxwell M.D.   On: 05/18/2013 13:52    Microbiology: No results found for this or any previous visit (from the past  240 hour(s)).   Labs: Basic Metabolic Panel:  Recent Labs Lab 05/18/13 0312 05/19/13 0332  NA 138 138  K 3.6* 4.0  CL 100 102  CO2 26 24  GLUCOSE 93 100*  BUN 9 10  CREATININE 0.58 0.64  CALCIUM 9.5 9.2   Liver Function Tests: No results found for this basename: AST, ALT, ALKPHOS, BILITOT, PROT, ALBUMIN,  in the last 168 hours No results found for this basename: LIPASE, AMYLASE,  in the last 168 hours No results found for this basename: AMMONIA,  in the last 168 hours CBC:  Recent Labs Lab 05/18/13 0312 05/19/13 0332  WBC 7.2 6.1  NEUTROABS 3.5  --   HGB 11.8* 10.6*  HCT 36.3 33.3*  MCV 78.1 78.0  PLT 397 320   Cardiac Enzymes: No results found for this basename: CKTOTAL, CKMB, CKMBINDEX, TROPONINI,  in the last 168 hours BNP: BNP (last 3 results) No results found for this basename: PROBNP,  in the last 8760 hours CBG: No results found for this basename: GLUCAP,  in the last 168 hours     Signed:  Zannie Covereetha Joseph  [facility] 05/19/2013, 6:11 PM   Patient seen and evaluated. I agree with above findings. Patient instructed to follow up with outpatient psychiatry services.  [employer]rlando Chastidy Ranker

## 2013-05-20 DIAGNOSIS — F329 Major depressive disorder, single episode, unspecified: Secondary | ICD-10-CM

## 2013-05-20 MED ORDER — OXYCODONE HCL 5 MG PO TABS: 5.0000 mg | ORAL_TABLET | Freq: Four times a day (QID) | ORAL | Status: DC | PRN

## 2013-05-20 NOTE — Progress Notes (Signed)
Clinical Social Work  CSW met with patient at bedside. Patient reports she is not feeling well and was unable to sleep well last night. CSW followed up re: inpatient placement. Patient reports she has considered options and does want to go to [facility] at this time. Patient drove herself to the ED expecting to get prescriptions refilled and then to return home. Patient reports she did not "leave my house in order" and did not pay her bills. Patient reports she needs to go home for a few days in order to pay bills so that she is not worried about utilities getting turned off. CSW inquired if there were others to help so that she could receive psychiatric treatment but patient reports limited support. Patient plans to return home and follow up on outpatient basis and if needed, return to [facility] for assessment.  Patient reports she is still feeling depressed about situation and environmental factors but denies any SI or HI. Patient contracts for safety and reports that she will be compliant with medications. CSW discussed case with psych MD (Dr. Adele Schilder) who reports that patient would benefit from inpatient treatment but does not meet criteria for involuntary commitment if she is refusing placement. CSW informed attending MD of psych disposition and that patient has outpatient resources.   CSW provided patient with outpatient resources and crisis hotline if needed. Patient accepting of outpatient resources and thanked CSW for time. Patient agreeable to follow up and reports she is motivated to start feeling better.  CSW is signing off but available if further needs arise.  [place], [place] [phone]

## 2013-05-20 NOTE — Progress Notes (Signed)
Discharge instructions explained and prescriptions given, pt verbalizes understanding of same. Stable on discharge.

## 2013-05-20 NOTE — Consult Note (Signed)
Follow Up Face Psychiatry Consult   Assessment: AXIS I:  Major Depression, single episode AXIS II:  Deferred AXIS III:   Past Medical History  Diagnosis Date  . Bilateral chronic knee pain   . Thyroid disease   . Anxiety    AXIS IV:  economic problems, housing problems, other psychosocial or environmental problems, problems related to social environment and problems with primary support group AXIS V:  41-50 serious symptoms  Plan:  Patient does not meet criteria for psychiatric inpatient admission. Supportive therapy provided about ongoing stressors. Discussed crisis plan, support from social network, calling 911, coming to the Emergency Department, and calling Suicide Hotline.  Subjective:   Regina Robertson is a 33 y.o. female patient admitted with tremors and knee pain.  Patient seen chart reviewed.   Patient does not want to be admitted inpatient.  She feels that her depression is somewhat better and she liked to get appointment as an outpatient upon discharge.  She endorse her depression is chronic and she denies any suicidal thoughts or homicidal thoughts.  Patient does not meet criteria for involuntary commitment and she liked to be followup outpatient upon discharge.  Past Psychiatric History: Past Medical History  Diagnosis Date  . Bilateral chronic knee pain   . Thyroid disease   . Anxiety     reports that she has quit smoking. She does not have any smokeless tobacco history on file. She reports that she drinks alcohol. She reports that she does not use illicit drugs. Family History  Problem Relation Age of Onset  . Diabetes Mother   . Cancer Father   . Diabetes Father   . Hypertension Father      Living Arrangements: Alone   Abuse/Neglect [facility]) Physical Abuse: Denies Verbal Abuse: Denies Sexual Abuse: Denies Allergies:  No Known Allergies  ACT Assessment Complete:  No:   Past Psychiatric History: Patient denies any previous history of psychiatric  inpatient treatment or any suicidal attempt.  She used to take Xanax prescribed by her primary care physician.  She denies a history of mania psychosis but endorsed suicidal thoughts in recent months.   Place of Residence:  Lives by her herself. 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 rate 20, height 5' 11"  (1.803 m), weight 196 lb 6.4 oz (89.086 kg), last menstrual period 05/01/2013, SpO2 100.00%.Body mass index is 27.4 kg/(m^2). Results for orders placed during the hospital encounter of 05/18/13 (from the past 72 hour(s))  CBC WITH DIFFERENTIAL     Status: Abnormal   Collection Time    05/18/13  3:12 AM      Result Value Ref Range   WBC 7.2  4.0 - 10.5 K/uL   RBC 4.65  3.87 - 5.11 MIL/uL   Hemoglobin 11.8 (*) 12.0 - 15.0 g/dL   HCT 36.3  36.0 - 46.0 %   MCV 78.1  78.0 - 100.0 fL   MCH 25.4 (*) 26.0 - 34.0 pg   MCHC 32.5  30.0 - 36.0 g/dL   RDW 15.7 (*) 11.5 - 15.5 %   Platelets 397  150 - 400 K/uL   Neutrophils Relative % 48  43 - 77 %   Neutro Abs 3.5  1.7 - 7.7 K/uL   Lymphocytes Relative 41  12 - 46 %   Lymphs Abs 2.9  0.7 - 4.0 K/uL   Monocytes Relative  10  3 - 12 %   Monocytes Absolute 0.7  0.1 - 1.0 K/uL   Eosinophils Relative 1  0 - 5 %   Eosinophils Absolute 0.0  0.0 - 0.7 K/uL   Basophils Relative 0  0 - 1 %   Basophils Absolute 0.0  0.0 - 0.1 K/uL  BASIC METABOLIC PANEL     Status: Abnormal   Collection Time    05/18/13  3:12 AM      Result Value Ref Range   Sodium 138  137 - 147 mEq/L   Potassium 3.6 (*) 3.7 - 5.3 mEq/L   Chloride 100  96 - 112 mEq/L   CO2 26  19 - 32 mEq/L   Glucose, Bld 93  70 - 99 mg/dL   BUN 9  6 - 23 mg/dL   Creatinine, Ser 0.58  0.50 - 1.10 mg/dL   Calcium 9.5  8.4 - 10.5 mg/dL   GFR calc non Af Amer >90  >90 mL/min   GFR calc Af Amer >90  >90 mL/min    Comment: (NOTE)     The eGFR has been calculated using the CKD EPI equation.     This calculation has not been validated in all clinical situations.     eGFR's persistently <90 mL/min signify possible Chronic Kidney     Disease.  PREGNANCY, URINE     Status: None   Collection Time    05/18/13  6:42 AM      Result Value Ref Range   Preg Test, Ur NEGATIVE  NEGATIVE   Comment:            THE SENSITIVITY OF THIS     METHODOLOGY IS >20 mIU/mL.  URINE RAPID DRUG SCREEN (HOSP PERFORMED)     Status: Abnormal   Collection Time    05/18/13  6:42 AM      Result Value Ref Range   Opiates POSITIVE (*) NONE DETECTED   Cocaine NONE DETECTED  NONE DETECTED   Benzodiazepines NONE DETECTED  NONE DETECTED   Amphetamines NONE DETECTED  NONE DETECTED   Tetrahydrocannabinol NONE DETECTED  NONE DETECTED   Barbiturates NONE DETECTED  NONE DETECTED   Comment:            DRUG SCREEN FOR MEDICAL PURPOSES     ONLY.  IF CONFIRMATION IS NEEDED     FOR ANY PURPOSE, NOTIFY LAB     WITHIN 5 DAYS.                LOWEST DETECTABLE LIMITS     FOR URINE DRUG SCREEN     Drug Class       Cutoff (ng/mL)     Amphetamine      1000     Barbiturate      200     Benzodiazepine   245     Tricyclics       809     Opiates          300     Cocaine          300     THC              50  VITAMIN B12     Status: Abnormal   Collection Time    05/18/13  7:55 AM      Result Value Ref Range   Vitamin B-12 1010 (*) 211 - 911 pg/mL   Comment: Performed at [place]  Status: None   Collection Time    05/18/13  7:55 AM      Result Value Ref Range   Folate 8.0     Comment: (NOTE)     Reference Ranges            Deficient:       0.4 - 3.3 ng/mL            Indeterminate:   3.4 - 5.4 ng/mL            Normal:              > 5.4 ng/mL     Performed at [place] TIBC     Status: Abnormal   Collection Time    05/18/13  7:55 AM      Result Value Ref Range   Iron 28 (*) 42 - 135 ug/dL    TIBC 426  250 - 470 ug/dL   Saturation Ratios 7 (*) 20 - 55 %   UIBC 398  125 - 400 ug/dL   Comment: Performed at [place]     Status: Abnormal   Collection Time    05/18/13  7:55 AM      Result Value Ref Range   Ferritin 6 (*) 10 - 291 ng/mL   Comment: Performed at [place]     Status: None   Collection Time    05/18/13  7:55 AM      Result Value Ref Range   Retic Ct Pct 1.2  0.4 - 3.1 %   RBC. 4.17  3.87 - 5.11 MIL/uL   Retic Count, Manual 50.0  19.0 - 186.0 K/uL  T4, FREE     Status: None   Collection Time    05/18/13  7:55 AM      Result Value Ref Range   Free T4 1.28  0.80 - 1.80 ng/dL   Comment: Performed at [facility]  TSH     Status: None   Collection Time    05/18/13 11:27 AM      Result Value Ref Range   TSH 0.898  0.350 - 4.500 uIU/mL   Comment: Please note change in reference range.     Performed at [place] PANEL     Status: Abnormal   Collection Time    05/19/13  3:32 AM      Result Value Ref Range   Sodium 138  137 - 147 mEq/L   Potassium 4.0  3.7 - 5.3 mEq/L   Chloride 102  96 - 112 mEq/L   CO2 24  19 - 32 mEq/L   Glucose, Bld 100 (*) 70 - 99 mg/dL   BUN 10  6 - 23 mg/dL   Creatinine, Ser 0.64  0.50 - 1.10 mg/dL   Calcium 9.2  8.4 - 10.5 mg/dL   GFR calc non Af Amer >90  >90 mL/min   GFR calc Af Amer >90  >90 mL/min   Comment: (NOTE)     The eGFR has been calculated using the CKD EPI equation.     This calculation has not been validated in all clinical situations.     eGFR's persistently <90 mL/min signify possible Chronic Kidney     Disease.  CBC     Status: Abnormal   Collection Time    05/19/13  3:32 AM      Result Value Ref Range  WBC 6.1  4.0 - 10.5 K/uL   RBC 4.27  3.87 - 5.11 MIL/uL   Hemoglobin 10.6 (*) 12.0 - 15.0 g/dL   HCT 33.3 (*) 36.0 - 46.0 %   MCV 78.0  78.0 - 100.0 fL   MCH 24.8 (*) 26.0 - 34.0 pg   MCHC 31.8  30.0 - 36.0 g/dL   RDW  15.9 (*) 11.5 - 15.5 %   Platelets 320  150 - 400 K/uL   Labs are reviewed.  Current Facility-Administered Medications  Medication Dose Route Frequency Provider Last Rate Last Dose  . acetaminophen (TYLENOL) tablet 650 mg  650 mg Oral Q6H PRN Rise Patience, MD   650 mg at 05/18/13 2641   Or  . acetaminophen (TYLENOL) suppository 650 mg  650 mg Rectal Q6H PRN Rise Patience, MD      . clonazePAM [facility] [place]) tablet 1 mg  1 mg Oral BID Domenic Polite, MD   1 mg at 05/20/13 0926  . hydrALAZINE (APRESOLINE) injection 10 mg  10 mg Intravenous Q4H PRN Rise Patience, MD      . ondansetron [facility]) tablet 4 mg  4 mg Oral Q6H PRN Rise Patience, MD       Or  . ondansetron [facility]) injection 4 mg  4 mg Intravenous Q6H PRN Rise Patience, MD      . oxyCODONE (Oxy IR/ROXICODONE) immediate release tablet 5 mg  5 mg Oral Q6H PRN Rise Patience, MD   5 mg at 05/20/13 1309  . primidone (MYSOLINE) tablet 50 mg  50 mg Oral Daily Domenic Polite, MD   50 mg at 05/20/13 0926  . pseudoephedrine (SUDAFED) 12 hr tablet 120 mg  120 mg Oral BID Domenic Polite, MD   120 mg at 05/20/13 0926  . sodium chloride 0.9 % injection 3 mL  3 mL Intravenous Q12H Rise Patience, MD   3 mL at 05/18/13 2112    Psychiatric Specialty Exam:     Blood pressure 134/73, pulse 87, temperature 98.3 F (36.8 C), temperature source Oral, resp. rate 20, height 5' 11"  (1.803 m), weight 196 lb 6.4 oz (89.086 kg), last menstrual period 05/01/2013, SpO2 100.00%.Body mass index is 27.4 kg/(m^2).  General Appearance: Fairly Groomed  Engineer, water::  Fair  Speech:  Slow  Volume:  Decreased  Mood:  Anxious and Dysphoric  Affect:  Constricted  Thought Process:  Linear  Orientation:  Full (Time, Place, and Person)  Thought Content:  Rumination  Suicidal Thoughts:  No  Homicidal Thoughts:  No  Memory:  Immediate;   Fair Recent;   Fair Remote;   Fair  Judgement:  Intact  Insight:  Fair  Psychomotor  Activity:  Decreased  Concentration:  Fair  Recall:  [employer] of Knowledge:Fair  Language: Fair  Akathisia:  No  Handed:  Right  AIMS (if indicated):     Assets:  Communication Skills Desire for Improvement Housing  Sleep:      Musculoskeletal: Strength & Muscle Tone: within normal limits Gait & Station: Patient is lying on the bed Patient leans: N/A  Treatment Plan Summary: Patient can be discharged with the recommendation to see outpatient .  The patient does not meet criteria for involuntary commitment .  She can be given outpatient referrals .  Please call [phone] if you have any further questions.  Arlyce Harman Deserea Bordley 05/20/2013 2:32 PM

## 2013-05-23 LAB — HEAVY METALS, RANDOM URINE
Arsenic Random, Urine: 4 mcg/g creat (ref <51)
Creatinine Random, Urine: 127.4 mg/dL (ref 20.0–320.0)

## 2013-05-27 ENCOUNTER — Ambulatory Visit: Payer: No Typology Code available for payment source | Attending: Internal Medicine | Admitting: Internal Medicine

## 2013-05-27 ENCOUNTER — Ambulatory Visit (HOSPITAL_BASED_OUTPATIENT_CLINIC_OR_DEPARTMENT_OTHER): Payer: No Typology Code available for payment source | Admitting: *Deleted

## 2013-05-27 VITALS — BP 131/85 | HR 90 | Temp 99.4°F | Resp 16

## 2013-05-27 DIAGNOSIS — G894 Chronic pain syndrome: Secondary | ICD-10-CM

## 2013-05-27 DIAGNOSIS — Z599 Problem related to housing and economic circumstances, unspecified: Secondary | ICD-10-CM

## 2013-05-27 DIAGNOSIS — M25559 Pain in unspecified hip: Secondary | ICD-10-CM | POA: Insufficient documentation

## 2013-05-27 DIAGNOSIS — Z87891 Personal history of nicotine dependence: Secondary | ICD-10-CM | POA: Insufficient documentation

## 2013-05-27 DIAGNOSIS — F411 Generalized anxiety disorder: Secondary | ICD-10-CM | POA: Insufficient documentation

## 2013-05-27 DIAGNOSIS — R259 Unspecified abnormal involuntary movements: Secondary | ICD-10-CM | POA: Insufficient documentation

## 2013-05-27 DIAGNOSIS — Z5987 Material hardship: Secondary | ICD-10-CM

## 2013-05-27 DIAGNOSIS — Z598 Other problems related to housing and economic circumstances: Secondary | ICD-10-CM

## 2013-05-27 DIAGNOSIS — E079 Disorder of thyroid, unspecified: Secondary | ICD-10-CM | POA: Insufficient documentation

## 2013-05-27 DIAGNOSIS — D649 Anemia, unspecified: Secondary | ICD-10-CM | POA: Insufficient documentation

## 2013-05-27 NOTE — Progress Notes (Signed)
LCSW met with patient who identified a decline in mood since she stopped working in 2009.  LCSW provided some encouragement and referral for mental health services with local mental health agencies.    LCSW offered for patient to follow up later this week for additional support if needed.  Christene Lye MSW, LCSW Duration 30 minutes

## 2013-05-27 NOTE — Progress Notes (Signed)
HFU Pt is here to address her pain in her b/l knees, left foot and her tail bone. Pt fell on her knees in December and recently fell again in the shower and has caused severe pain.

## 2013-05-31 NOTE — Progress Notes (Signed)
Patient ID: Regina Robertson, female   DOB: 07/12/1980, 33 y.o.   MRN: [id number]   CC: tremors   HPI: Pt is 33 yo female recently hospitalized for tremors, presents for hospital follow up. Has persistent tremors, says clonazepam helps. No chest pain, no specific focal neurological symptoms.   No Known Allergies Past Medical History  Diagnosis Date  . Bilateral chronic knee pain   . Thyroid disease   . Anxiety    Current Outpatient Prescriptions on File Prior to Visit  Medication Sig Dispense Refill  . clonazePAM (KLONOPIN) 1 MG tablet Take 1 tablet (1 mg total) by mouth 2 (two) times daily.  30 tablet  0  . ibuprofen (ADVIL,MOTRIN) 200 MG tablet Take 800 mg by mouth every 6 (six) hours as needed for moderate pain.      Marland Kitchen. oxyCODONE (OXY IR/ROXICODONE) 5 MG immediate release tablet Take 1 tablet (5 mg total) by mouth every 6 (six) hours as needed for moderate pain.  20 tablet  0   No current facility-administered medications on file prior to visit.   Family History  Problem Relation Age of Onset  . Diabetes Mother   . Cancer Father   . Diabetes Father   . Hypertension Father    History   Social History  . Marital Status: Single    Spouse Name: N/A    Number of Children: N/A  . Years of Education: N/A   Occupational History  . Not on file.   Social History Main Topics  . Smoking status: Former Games developermoker  . Smokeless tobacco: Not on file  . Alcohol Use: Yes  . Drug Use: No  . Sexual Activity: Not on file   Other Topics Concern  . Not on file   Social History Narrative  . No narrative on file    Review of Systems  Constitutional: Negative for fever, chills, diaphoresis, activity change, appetite change and fatigue.  HENT: Negative for ear pain, nosebleeds, congestion, facial swelling, rhinorrhea, neck pain, neck stiffness and ear discharge.   Eyes: Negative for pain, discharge, redness, itching and visual disturbance.  Respiratory: Negative for cough, choking, chest  tightness, shortness of breath, wheezing and stridor.   Cardiovascular: Negative for chest pain, palpitations and leg swelling.  Gastrointestinal: Negative for abdominal distention.  Genitourinary: Negative for dysuria, urgency, frequency, hematuria, flank pain, decreased urine volume, difficulty urinating and dyspareunia.  Musculoskeletal: Negative for back pain, joint swelling, arthralgias and gait problem.  Neurological: Negative for dizziness, tremors, seizures, syncope, facial asymmetry, speech difficulty, weakness, light-headedness, numbness and headaches.  Hematological: Negative for adenopathy. Does not bruise/bleed easily.  Psychiatric/Behavioral: Negative for hallucinations, behavioral problems, confusion, dysphoric mood, decreased concentration and agitation.    Objective:   Filed Vitals:   05/27/13 1036  BP: 131/85  Pulse: 90  Temp: 99.4 F (37.4 C)  Resp: 16    Physical Exam  Constitutional: Appears well-developed and well-nourished. No distress.  HENT: Normocephalic. External right and left ear normal. Oropharynx is clear and moist.  Eyes: Conjunctivae and EOM are normal. PERRLA, no scleral icterus.  Neck: Normal ROM. Neck supple. No JVD. No tracheal deviation. No thyromegaly.  CVS: RRR, S1/S2 +, no murmurs, no gallops, no carotid bruit.  Pulmonary: Effort and breath sounds normal, no stridor, rhonchi, wheezes, rales.  Abdominal: Soft. BS +,  no distension, tenderness, rebound or guarding.  Musculoskeletal: Normal range of motion. No edema and no tenderness.  Lymphadenopathy: No lymphadenopathy noted, cervical, inguinal. Neuro: Alert. Normal reflexes, muscle tone coordination.  No cranial nerve deficit. Skin: Skin is warm and dry. No rash noted. Not diaphoretic. No erythema. No pallor.  Psychiatric: Normal mood and affect. Behavior, judgment, thought content normal.   Lab Results  Component Value Date   WBC 6.1 05/19/2013   HGB 10.6* 05/19/2013   HCT 33.3* 05/19/2013    MCV 78.0 05/19/2013   PLT 320 05/19/2013   Lab Results  Component Value Date   CREATININE 0.64 05/19/2013   BUN 10 05/19/2013   NA 138 05/19/2013   K 4.0 05/19/2013   CL 102 05/19/2013   CO2 24 05/19/2013    No results found for this basename: HGBA1C   Lipid Panel  No results found for this basename: chol, trig, hdl, cholhdl, vldl, ldlcalc       Assessment and plan:   Patient Active Problem List   Diagnosis Date Noted  . Tremor 05/18/2013  . Anemia 05/18/2013  . Knee pain 05/18/2013  . Tremors of nervous system 05/18/2013   - unclear etiology - recommended referral to neurologist but pt wants to hold off for now - Clonazepam help but we [facility] pt [facility]

## 2013-06-12 ENCOUNTER — Encounter (HOSPITAL_COMMUNITY): Payer: Self-pay | Admitting: Emergency Medicine

## 2013-06-12 ENCOUNTER — Emergency Department (HOSPITAL_COMMUNITY)
Admission: EM | Admit: 2013-06-12 | Discharge: 2013-06-12 | Disposition: A | Discharge status: Home / Self Care | Payer: No Typology Code available for payment source | Attending: Emergency Medicine | Admitting: Emergency Medicine

## 2013-06-12 DIAGNOSIS — G8929 Other chronic pain: Secondary | ICD-10-CM | POA: Insufficient documentation

## 2013-06-12 DIAGNOSIS — R259 Unspecified abnormal involuntary movements: Secondary | ICD-10-CM | POA: Insufficient documentation

## 2013-06-12 DIAGNOSIS — M25569 Pain in unspecified knee: Secondary | ICD-10-CM | POA: Insufficient documentation

## 2013-06-12 DIAGNOSIS — Z760 Encounter for issue of repeat prescription: Secondary | ICD-10-CM

## 2013-06-12 DIAGNOSIS — F411 Generalized anxiety disorder: Secondary | ICD-10-CM | POA: Insufficient documentation

## 2013-06-12 DIAGNOSIS — Z79899 Other long term (current) drug therapy: Secondary | ICD-10-CM | POA: Insufficient documentation

## 2013-06-12 DIAGNOSIS — Z862 Personal history of diseases of the blood and blood-forming organs and certain disorders involving the immune mechanism: Secondary | ICD-10-CM | POA: Insufficient documentation

## 2013-06-12 DIAGNOSIS — Z8639 Personal history of other endocrine, nutritional and metabolic disease: Secondary | ICD-10-CM | POA: Insufficient documentation

## 2013-06-12 DIAGNOSIS — Z87891 Personal history of nicotine dependence: Secondary | ICD-10-CM | POA: Insufficient documentation

## 2013-06-12 MED ORDER — OXYCODONE HCL 5 MG PO TABS: 5.0000 mg | ORAL_TABLET | Freq: Four times a day (QID) | ORAL | Status: DC | PRN

## 2013-06-12 MED ORDER — CLONAZEPAM 1 MG PO TABS: 1.0000 mg | ORAL_TABLET | Freq: Two times a day (BID) | ORAL | Status: DC | PRN

## 2013-06-12 NOTE — ED Provider Notes (Signed)
CSN: [id number]     Arrival date & time 06/12/13  [id number] History   First MD Initiated Contact with Patient 06/12/13 [phone]     Chief Complaint  Patient presents with  . Medication Refill     (Consider location/radiation/quality/duration/timing/severity/associated sxs/prior Treatment) HPI Comments: Patient is a 33 year old female with bilateral chronic knee pain, thyroid disease and anxiety who presents for medication refill. She reports that over the past few months she has been having gradually worsening "twitching". She was ultimately admitted for this problem where she had a neurology consult. Neurology felt as though her twitching was psychogenic in nature. She had MRIs of her knees which showed a fatty deposit. She believes this is the cause of her chronic pain. She followed up as scheduled with Dr[facility] At that time Dr. Izola PriceMyers reported that she could not give her any Klonopin or Percocet as they do not prescribe narcotics or benzos at the [facility]. The patient is actively trying to get into a pain clinic. She reports that they told her that she could not call them and that they needed to call her. She's been out of her Klonopin and Percocet for approximately one week. Her twitching is gradually worsening. No new pain, fever, chills, numbness, weakness, paresthesias.  The history is provided by the patient. No language interpreter was used.    Past Medical History  Diagnosis Date  . Bilateral chronic knee pain   . Thyroid disease   . Anxiety    Past Surgical History  Procedure Laterality Date  . Thyroidectomy     Family History  Problem Relation Age of Onset  . Diabetes Mother   . Cancer Father   . Diabetes Father   . Hypertension Father    History  Substance Use Topics  . Smoking status: Former Games developermoker  . Smokeless tobacco: Not on file  . Alcohol Use: Yes   OB History   Grav Para Term Preterm Abortions TAB SAB Ect Mult  Living                 Review of Systems  Constitutional: Negative for fever and chills.  Respiratory: Negative for shortness of breath.   Cardiovascular: Negative for chest pain.  Gastrointestinal: Negative for nausea, vomiting and abdominal pain.  Musculoskeletal: Positive for arthralgias and myalgias.  Neurological: Positive for tremors. Negative for weakness and numbness.  All other systems reviewed and are negative.     Allergies  Review of patient's allergies indicates no known allergies.  Home Medications   Prior to Admission medications   Medication Sig Start Date End Date Taking? Authorizing Provider  clonazePAM (KLONOPIN) 1 MG tablet Take 1 tablet (1 mg total) by mouth 2 (two) times daily. 05/19/13  Yes Zannie CovePreetha Joseph, MD  ibuprofen (ADVIL,MOTRIN) 200 MG tablet Take 800 mg by mouth every 6 (six) hours as needed for moderate pain.   Yes Historical Provider, MD  oxyCODONE (OXY IR/ROXICODONE) 5 MG immediate release tablet Take 1 tablet (5 mg total) by mouth every 6 (six) hours as needed for moderate pain. 05/20/13  Yes Penny Piarlando Vega, MD  clonazePAM (KLONOPIN) 1 MG tablet Take 1 tablet (1 mg total) by mouth 2 (two) times daily as needed for anxiety (spams). 06/12/13   Mora BellmanHannah S Cordell Guercio, PA-C  oxyCODONE (ROXICODONE) 5 MG immediate release tablet Take 1 tablet (5 mg total) by mouth every 6 (six) hours as needed for severe pain. 06/12/13   Mora BellmanHannah S Cleota Pellerito,  PA-C   BP 135/85  Pulse 101  Temp(Src) 98.8 F (37.1 C) (Oral)  Resp 18  SpO2 100%  LMP 05/01/2013 Physical Exam  Nursing note and vitals reviewed. Constitutional: She is oriented to person, place, and time. She appears well-developed and well-nourished. No distress.  HENT:  Head: Normocephalic and atraumatic.  Right Ear: External ear normal.  Left Ear: External ear normal.  Nose: Nose normal.  Mouth/Throat: Oropharynx is clear and moist.  Eyes: Conjunctivae are normal.  Neck: Normal range of motion.  Cardiovascular:  Normal rate, regular rhythm, normal heart sounds, intact distal pulses and normal pulses.   Pulses:      Dorsalis pedis pulses are 2+ on the right side, and 2+ on the left side.       Posterior tibial pulses are 2+ on the right side, and 2+ on the left side.  Pulmonary/Chest: Effort normal and breath sounds normal. No stridor. No respiratory distress. She has no wheezes. She has no rales.  Abdominal: Soft. She exhibits no distension.  Musculoskeletal: Normal range of motion.  Strength 5/5 in lower extremities bilaterally.   Neurological: She is alert and oriented to person, place, and time. She has normal strength.  Skin: Skin is warm and dry. She is not diaphoretic. No erythema.  Psychiatric: She has a normal mood and affect. Her behavior is normal.    ED Course  Procedures (including critical care time) Labs Review Labs Reviewed - No data to display  Imaging Review No results found.   EKG Interpretation None      MDM   Final diagnoses:  Medication refill    Patient presents to ED for medication refill. She is actively trying to get into pain management. Klonopin and Percocet appear to be the only thing that improves her tremors. Will give short course of both Percocet and Klonopin as patient tries to get into pain management. She understands that she needs to use these medications sparingly. Return instructions given. Vital signs stable for discharge. Patient / Family / Caregiver informed of clinical course, understand medical decision-making process, and agree with plan.   Mora Bellman, PA-C 06/12/13 [phone]

## 2013-06-12 NOTE — ED Provider Notes (Signed)
Medical screening examination/treatment/procedure(s) were performed by non-physician practitioner and as supervising physician I was immediately available for consultation/collaboration.   EKG Interpretation None        Gwyneth Sprout, MD 06/12/13 1248

## 2013-06-12 NOTE — Discharge Instructions (Signed)
Medication Refill, Emergency Department  We have refilled your medication today as a courtesy to you. It is best for your medical care, however, to take care of getting refills done through your primary caregiver's office. They have your records and can do a better job of follow-up than we can in the emergency department.  On maintenance medications, we often only prescribe enough medications to get you by until you are able to see your regular caregiver. This is a more expensive way to refill medications.  In the future, please plan for refills so that you will not have to use the emergency department for this.  Thank you for your help. Your help allows us to better take care of the daily emergencies that enter our department.  Document Released: 04/27/2003 Document Revised: 04/02/2011 Document Reviewed: 01/08/2005  [employer]® Patient Information ©2014 [employer].

## 2013-06-12 NOTE — ED Notes (Signed)
Pt requests refill of percocet and klonopin for chronic pain and "twitching".

## 2013-06-26 ENCOUNTER — Other Ambulatory Visit: Payer: Self-pay

## 2013-07-03 ENCOUNTER — Emergency Department (HOSPITAL_COMMUNITY)
Admission: EM | Admit: 2013-07-03 | Discharge: 2013-07-03 | Disposition: A | Discharge status: Home / Self Care | Payer: No Typology Code available for payment source | Attending: Emergency Medicine | Admitting: Emergency Medicine

## 2013-07-03 ENCOUNTER — Encounter (HOSPITAL_COMMUNITY): Payer: Self-pay | Admitting: Emergency Medicine

## 2013-07-03 DIAGNOSIS — R259 Unspecified abnormal involuntary movements: Secondary | ICD-10-CM | POA: Insufficient documentation

## 2013-07-03 DIAGNOSIS — Z8639 Personal history of other endocrine, nutritional and metabolic disease: Secondary | ICD-10-CM | POA: Insufficient documentation

## 2013-07-03 DIAGNOSIS — R251 Tremor, unspecified: Secondary | ICD-10-CM

## 2013-07-03 DIAGNOSIS — Z760 Encounter for issue of repeat prescription: Secondary | ICD-10-CM | POA: Insufficient documentation

## 2013-07-03 DIAGNOSIS — Z87891 Personal history of nicotine dependence: Secondary | ICD-10-CM | POA: Insufficient documentation

## 2013-07-03 DIAGNOSIS — Z862 Personal history of diseases of the blood and blood-forming organs and certain disorders involving the immune mechanism: Secondary | ICD-10-CM | POA: Insufficient documentation

## 2013-07-03 DIAGNOSIS — F411 Generalized anxiety disorder: Secondary | ICD-10-CM | POA: Insufficient documentation

## 2013-07-03 DIAGNOSIS — Z79899 Other long term (current) drug therapy: Secondary | ICD-10-CM | POA: Insufficient documentation

## 2013-07-03 DIAGNOSIS — G8929 Other chronic pain: Secondary | ICD-10-CM | POA: Insufficient documentation

## 2013-07-03 MED ORDER — IBUPROFEN 800 MG PO TABS
800.0000 mg | ORAL_TABLET | Freq: Once | ORAL | Status: DC
  Filled 2013-07-03: qty 1

## 2013-07-03 MED ORDER — MELOXICAM 7.5 MG PO TABS: 7.5000 mg | ORAL_TABLET | Freq: Every day | ORAL | Status: DC

## 2013-07-03 MED ORDER — CLONAZEPAM 1 MG PO TABS: 1.0000 mg | ORAL_TABLET | Freq: Two times a day (BID) | ORAL | Status: DC | PRN

## 2013-07-03 NOTE — ED Notes (Signed)
Per pt, states she needs a refill on her percocet and klonopin for her "twitching"-states she saw neuro here and was told her jerking was due to stress

## 2013-07-03 NOTE — ED Provider Notes (Signed)
CSN: [id number]     Arrival date & time 07/03/13  1049 History   First MD Initiated Contact with Patient 07/03/13 1129     Chief Complaint  Patient presents with  . Medication Refill     (Consider location/radiation/quality/duration/timing/severity/associated sxs/prior Treatment) HPI Comments: Pt is here to get a refill of her medications for her left lower extremity tremor. Pt states that the only thing that help is oxycodone and klonopin. Denies change is symptoms. States that she was hospitalized and worked up and nothing was found. Pt states that she was seen at the community clinic but they wouldn't give her the medication because she was told that they don't write for those kinds of medication. She states that she has an appointment with pain management in august.  The history is provided by the patient. No language interpreter was used.    Past Medical History  Diagnosis Date  . Bilateral chronic knee pain   . Thyroid disease   . Anxiety    Past Surgical History  Procedure Laterality Date  . Thyroidectomy     Family History  Problem Relation Age of Onset  . Diabetes Mother   . Cancer Father   . Diabetes Father   . Hypertension Father    History  Substance Use Topics  . Smoking status: Former Games developermoker  . Smokeless tobacco: Not on file  . Alcohol Use: Yes   OB History   Grav Para Term Preterm Abortions TAB SAB Ect Mult Living                 Review of Systems  Constitutional: Negative.   Respiratory: Negative.   Cardiovascular: Negative.       Allergies  Review of patient's allergies indicates no known allergies.  Home Medications   Prior to Admission medications   Medication Sig Start Date End Date Taking? Authorizing Provider  clonazePAM (KLONOPIN) 1 MG tablet Take 1 tablet (1 mg total) by mouth 2 (two) times daily. 05/19/13   Zannie CovePreetha Joseph, MD  clonazePAM (KLONOPIN) 1 MG tablet Take 1 tablet (1 mg total) by mouth 2 (two) times daily as needed for  anxiety (spams). 07/03/13   Teressa LowerVrinda Tashonna Descoteaux, NP  ibuprofen (ADVIL,MOTRIN) 200 MG tablet Take 800 mg by mouth every 6 (six) hours as needed for moderate pain.    Historical Provider, MD  oxyCODONE (OXY IR/ROXICODONE) 5 MG immediate release tablet Take 1 tablet (5 mg total) by mouth every 6 (six) hours as needed for moderate pain. 05/20/13   Penny Piarlando Vega, MD  oxyCODONE (ROXICODONE) 5 MG immediate release tablet Take 1 tablet (5 mg total) by mouth every 6 (six) hours as needed for severe pain. 06/12/13   Mora BellmanHannah S Merrell, PA-C   LMP 06/26/2013 Physical Exam  Nursing note and vitals reviewed. Constitutional: She is oriented to person, place, and time. She appears well-developed and well-nourished.  Cardiovascular: Normal rate and regular rhythm.   Pulmonary/Chest: Effort normal and breath sounds normal.  Musculoskeletal: Normal range of motion.  Neurological: She is alert and oriented to person, place, and time. She exhibits normal muscle tone. Coordination normal.  Constant shaking of the left lower extremity. Equal strength bilaterally    ED Course  Procedures (including critical care time) Labs Review Labs Reviewed - No data to display  Imaging Review No results found.   EKG Interpretation None      MDM   Final diagnoses:  Medication refill  Tremor    Discussed with pt that i would  give klonopin today but no percocet. Discussed that thru the er we don't do chronic pain management. Pt verbalized understanding    Teressa LowerVrinda Angelise Petrich, NP 07/03/13 1145

## 2013-07-03 NOTE — ED Notes (Signed)
Patient wanting narcotics-NP/RN attempted to explain to patient that ED is not able to do pain management
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
## 2013-07-03 NOTE — Discharge Instructions (Signed)
Medication Refill, Emergency Department  We have refilled your medication today as a courtesy to you. It is best for your medical care, however, to take care of getting refills done through your primary caregiver's office. They have your records and can do a better job of follow-up than we can in the emergency department.  On maintenance medications, we often only prescribe enough medications to get you by until you are able to see your regular caregiver. This is a more expensive way to refill medications.  In the future, please plan for refills so that you will not have to use the emergency department for this.  Thank you for your help. Your help allows us to better take care of the daily emergencies that enter our department.  Document Released: 04/27/2003 Document Revised: 04/02/2011 Document Reviewed: 01/08/2005  [employer]® Patient Information ©2014 [employer].

## 2013-07-03 NOTE — ED Notes (Signed)
Pt was angry and stated that she "needed to see a hospitalist" and that this hospital was horrible and would not help her. Pt explained she would not get narcotics. And that she was given prescriptions for two other medications. Charge went over pts discharge appers with her and review medications.

## 2013-07-03 NOTE — ED Notes (Signed)
Charge talked with PA, pt specifically asked PA for percocets. Pt not given narcotic prescription. Pt dx with tremors, charge and nurse first assisted pt to car. Charge offered for pt to wait in lobby till tremors subsided . Pt multiple times denied help and reported she wanted to get into her car. Pt was told to wait in her car until tremors subsided.

## 2013-07-08 NOTE — ED Provider Notes (Signed)
Medical screening examination/treatment/procedure(s) were performed by non-physician practitioner and as supervising physician I was immediately available for consultation/collaboration.    Yanet Balliet, MD 07/08/13 1900 

## 2013-07-09 ENCOUNTER — Other Ambulatory Visit: Payer: Self-pay | Admitting: Internal Medicine

## 2013-07-09 ENCOUNTER — Ambulatory Visit: Payer: Self-pay | Attending: Internal Medicine | Admitting: *Deleted

## 2013-07-09 DIAGNOSIS — Z599 Problem related to housing and economic circumstances, unspecified: Secondary | ICD-10-CM

## 2013-07-09 MED ORDER — CLONAZEPAM 1 MG PO TABS
1.0000 mg | ORAL_TABLET | Freq: Two times a day (BID) | ORAL | Status: DC | PRN
Start: 2013-07-09 — End: 2013-08-19

## 2013-07-09 MED ORDER — ACETAMINOPHEN-CODEINE #3 300-30 MG PO TABS: 1.0000 | ORAL_TABLET | ORAL | Status: DC | PRN

## 2013-07-09 NOTE — Progress Notes (Signed)
Patient had several areas that she needed help in navigating her health care.  LCSW helped patient get [facility]. LCSW also coordinated with physician for necessary refills in order to defer ED visits.  LCSW also gave patient information to contact the [employer] to begin disability application.  Patient was appreciative of these efforts and will return as needed.    Beverly Sessionsywan J Lindsey MSW, LCSW

## 2013-07-22 ENCOUNTER — Telehealth: Payer: Self-pay | Admitting: *Deleted

## 2013-07-22 ENCOUNTER — Other Ambulatory Visit: Payer: Self-pay | Admitting: *Deleted

## 2013-07-22 NOTE — Telephone Encounter (Signed)
Patient states that she needs refill on Tylenol #3.

## 2013-07-23 ENCOUNTER — Telehealth: Payer: Self-pay | Admitting: Emergency Medicine

## 2013-07-23 NOTE — Telephone Encounter (Signed)
Spoke with pt and informed Dr. Hyman HopesJegede will not refill Tylenol #3 at this time. Informed pt we will re- evaluate at next OV

## 2013-07-30 ENCOUNTER — Ambulatory Visit: Payer: Self-pay

## 2013-07-30 ENCOUNTER — Telehealth: Payer: Self-pay | Admitting: Internal Medicine

## 2013-07-30 NOTE — Telephone Encounter (Signed)
Pt. Needs medication refill for Tylenol #3. Please call patient

## 2013-07-31 ENCOUNTER — Other Ambulatory Visit: Payer: Self-pay | Admitting: *Deleted

## 2013-07-31 MED ORDER — ACETAMINOPHEN-CODEINE #3 300-30 MG PO TABS: 1.0000 | ORAL_TABLET | ORAL | Status: DC | PRN

## 2013-07-31 NOTE — Telephone Encounter (Signed)
Spoke with patient and she is aware that  Her prescription for tylenol #3 is ready to be picked  Up and will be at the front desk

## 2013-08-17 ENCOUNTER — Ambulatory Visit: Payer: No Typology Code available for payment source | Attending: Internal Medicine | Admitting: *Deleted

## 2013-08-17 ENCOUNTER — Telehealth: Payer: Self-pay | Admitting: *Deleted

## 2013-08-17 DIAGNOSIS — F32A Depression, unspecified: Secondary | ICD-10-CM

## 2013-08-17 DIAGNOSIS — F329 Major depressive disorder, single episode, unspecified: Secondary | ICD-10-CM

## 2013-08-17 DIAGNOSIS — F3289 Other specified depressive episodes: Secondary | ICD-10-CM
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
## 2013-08-17 NOTE — Telephone Encounter (Signed)
Patient identifies that she needs a refill for medication - Acetaminophen-codeine 300-30mg  tablet.

## 2013-08-19 ENCOUNTER — Telehealth: Payer: Self-pay | Admitting: Internal Medicine

## 2013-08-19 ENCOUNTER — Emergency Department (HOSPITAL_COMMUNITY): Payer: No Typology Code available for payment source

## 2013-08-19 ENCOUNTER — Emergency Department (HOSPITAL_COMMUNITY)
Admission: EM | Admit: 2013-08-19 | Discharge: 2013-08-19 | Disposition: A | Discharge status: Home / Self Care | Payer: No Typology Code available for payment source | Attending: Emergency Medicine | Admitting: Emergency Medicine

## 2013-08-19 ENCOUNTER — Telehealth: Payer: Self-pay

## 2013-08-19 ENCOUNTER — Encounter (HOSPITAL_COMMUNITY): Payer: Self-pay | Admitting: Emergency Medicine

## 2013-08-19 DIAGNOSIS — R259 Unspecified abnormal involuntary movements: Secondary | ICD-10-CM | POA: Insufficient documentation

## 2013-08-19 DIAGNOSIS — Z791 Long term (current) use of non-steroidal anti-inflammatories (NSAID): Secondary | ICD-10-CM | POA: Insufficient documentation

## 2013-08-19 DIAGNOSIS — Z862 Personal history of diseases of the blood and blood-forming organs and certain disorders involving the immune mechanism: Secondary | ICD-10-CM | POA: Insufficient documentation

## 2013-08-19 DIAGNOSIS — IMO0002 Reserved for concepts with insufficient information to code with codable children: Secondary | ICD-10-CM | POA: Insufficient documentation

## 2013-08-19 DIAGNOSIS — Y92009 Unspecified place in unspecified non-institutional (private) residence as the place of occurrence of the external cause: Secondary | ICD-10-CM | POA: Insufficient documentation

## 2013-08-19 DIAGNOSIS — Z79899 Other long term (current) drug therapy: Secondary | ICD-10-CM | POA: Insufficient documentation

## 2013-08-19 DIAGNOSIS — F411 Generalized anxiety disorder: Secondary | ICD-10-CM | POA: Insufficient documentation

## 2013-08-19 DIAGNOSIS — G8929 Other chronic pain: Secondary | ICD-10-CM | POA: Insufficient documentation

## 2013-08-19 DIAGNOSIS — S79919A Unspecified injury of unspecified hip, initial encounter: Secondary | ICD-10-CM | POA: Insufficient documentation

## 2013-08-19 DIAGNOSIS — Z8639 Personal history of other endocrine, nutritional and metabolic disease: Secondary | ICD-10-CM | POA: Insufficient documentation

## 2013-08-19 DIAGNOSIS — R296 Repeated falls: Secondary | ICD-10-CM | POA: Insufficient documentation

## 2013-08-19 DIAGNOSIS — M25551 Pain in right hip: Secondary | ICD-10-CM

## 2013-08-19 DIAGNOSIS — Z87891 Personal history of nicotine dependence: Secondary | ICD-10-CM | POA: Insufficient documentation

## 2013-08-19 DIAGNOSIS — Y9389 Activity, other specified: Secondary | ICD-10-CM | POA: Insufficient documentation

## 2013-08-19 DIAGNOSIS — S79929A Unspecified injury of unspecified thigh, initial encounter: Principal | ICD-10-CM

## 2013-08-19 MED ORDER — IBUPROFEN 200 MG PO TABS
600.0000 mg | ORAL_TABLET | Freq: Once | ORAL | Status: AC
  Administered 2013-08-19: 600 mg via ORAL
  Filled 2013-08-19: qty 3

## 2013-08-19 MED ORDER — OXYCODONE-ACETAMINOPHEN 5-325 MG PO TABS
2.0000 | ORAL_TABLET | Freq: Once | ORAL | Status: AC
  Administered 2013-08-19: 2 via ORAL
  Filled 2013-08-19: qty 2

## 2013-08-19 MED ORDER — DIAZEPAM 5 MG PO TABS
5.0000 mg | ORAL_TABLET | Freq: Once | ORAL | Status: AC
  Administered 2013-08-19: 5 mg via ORAL
  Filled 2013-08-19: qty 1

## 2013-08-19 NOTE — Telephone Encounter (Signed)
Patient requesting refill on tylenol #3 Can we refill this for her?

## 2013-08-19 NOTE — Telephone Encounter (Signed)
Call and ask the patient why she needs Tylenol No. 3

## 2013-08-19 NOTE — ED Notes (Signed)
Brought in by PTAR from home with c/o right hip pain after fall tonight.  Per PTAR, pt reported that she has not been "unable to walk throughout the day"--- she was trying to use the bathroom with a walker when "legs gave out".  Pt reports chronic tremors to her legs.  Pt reports pain to right hip and right buttocks.  Pt presents to ED with same complaints; no s/s obvious deformity to right leg noted--- no shortening or external rotation.

## 2013-08-19 NOTE — ED Notes (Signed)
Bed: WA03 Expected date:  Expected time:  Means of arrival:  Comments: 33yo F, fall, hip pain, tremors

## 2013-08-19 NOTE — Telephone Encounter (Signed)
Pt calling for refill on Tylenol #3. Please f/u with pt, she has been waiting for call/rseponse from nurse since Mon, 08/17/13.

## 2013-08-19 NOTE — ED Provider Notes (Signed)
CSN: [id number]     Arrival date & time 08/19/13  2224 History   First MD Initiated Contact with Patient 08/19/13 2225     Chief Complaint  Patient presents with  . Fall  . Pelvic Pain     (Consider location/radiation/quality/duration/timing/severity/associated sxs/prior Treatment) HPI  33yF with R hip pain. Fell earlier today onto R side. Persistent R hip pain since. Pt needing constant redirection. She keeps trying to talk about history of LE tremor and difficulty in getting her pain medication and klonopin filled. This tremor makes ambulation difficult and this is why she fell today. Did strike head on door when she fell but denies LOC and no significant HA.   Past Medical History  Diagnosis Date  . Bilateral chronic knee pain   . Thyroid disease   . Anxiety    Past Surgical History  Procedure Laterality Date  . Thyroidectomy     Family History  Problem Relation Age of Onset  . Diabetes Mother   . Cancer Father   . Diabetes Father   . Hypertension Father    History  Substance Use Topics  . Smoking status: Former Games developermoker  . Smokeless tobacco: Not on file  . Alcohol Use: Yes   OB History   Grav Para Term Preterm Abortions TAB SAB Ect Mult Living                 Review of Systems  All systems reviewed and negative, other than as noted in HPI.   Allergies  Review of patient's allergies indicates no known allergies.  Home Medications   Prior to Admission medications   Medication Sig Start Date End Date Taking? Authorizing Provider  acetaminophen-codeine (TYLENOL #3) 300-30 MG per tablet Take 1 tablet by mouth every 4 (four) hours as needed. 07/31/13   Doris Cheadleeepak Advani, MD  clonazePAM (KLONOPIN) 1 MG tablet Take 1 tablet (1 mg total) by mouth 2 (two) times daily as needed for anxiety (spams). 07/09/13   Jeanann Lewandowskylugbemiga Jegede, MD  ibuprofen (ADVIL,MOTRIN) 200 MG tablet Take 800 mg by mouth every 6 (six) hours as needed for moderate pain.    Historical Provider, MD   meloxicam (MOBIC) 7.5 MG tablet Take 1 tablet (7.5 mg total) by mouth daily. 07/03/13   Teressa LowerVrinda Pickering, NP  oxyCODONE (OXY IR/ROXICODONE) 5 MG immediate release tablet Take 1 tablet (5 mg total) by mouth every 6 (six) hours as needed for moderate pain. 05/20/13   Penny Piarlando Vega, MD  oxyCODONE (ROXICODONE) 5 MG immediate release tablet Take 1 tablet (5 mg total) by mouth every 6 (six) hours as needed for severe pain. 06/12/13   Ramon DredgeHannah S Merrell, PA-C   BP 136/90  Pulse 89  Temp(Src) 99.6 F (37.6 C) (Oral)  Resp 20  SpO2 100% Physical Exam  Nursing note and vitals reviewed. Constitutional: She appears well-developed and well-nourished. No distress.  Laying in bed. LE shaking.   HENT:  Head: Normocephalic and atraumatic.  Eyes: Conjunctivae are normal. Right eye exhibits no discharge. Left eye exhibits no discharge.  Neck: Neck supple.  Cardiovascular: Normal rate, regular rhythm and normal heart sounds.  Exam reveals no gallop and no friction rub.   No murmur heard. Pulmonary/Chest: Effort normal and breath sounds normal. No respiratory distress.  Abdominal: Soft. She exhibits no distension. There is no tenderness.  Musculoskeletal: She exhibits no edema and no tenderness.  Pt wincing and reacting in pain essentially when just palpating her skin.  Regardless, tenderness lower back paraspinally and midline,  over R SI joint, R greater trochanter. N concerning skin changes. RLE symmetric as compared to L. No shortening or malrotation. Report increased pain with hip flexion. Reports she could not raise it her self. Knee held in full extension and says she cannot flex the knee because of her tremor. Palpable DP pulse. Sensation intact to light touch.   Neurological: She is alert.  Skin: Skin is warm and dry.  Psychiatric: She has a normal mood and affect. Her behavior is normal. Thought content normal.    ED Course  Procedures (including critical care time) Labs Review Labs Reviewed - No  data to display  Imaging Review Dg Hip Complete Right  08/19/2013   CLINICAL DATA:  Right hip pain secondary to a fall today.  EXAM: RIGHT HIP - COMPLETE 2+ VIEW  COMPARISON:  None.  FINDINGS: There is no fracture or dislocation. There are minimal degenerative changes of the right femoral head and of the right sacroiliac joint.  IMPRESSION: No acute osseous abnormalities.   Electronically Signed   By: Geanie Cooley M.D.   On: 08/19/2013 23:13     EKG Interpretation None      MDM   Final diagnoses:  Hip pain, right   33yF with lower back and R hip pain after fall. Imaging negative. B/l LE tremor. She reports this is chronic. Very low suspicion for fx. I have never seen anyone with a hip fx, lumbar compression fx, etc able to continuously shake LE so vigorously. When laying in bed, aside from constant LE shaking, she does not otherwise appear uncomfortable.   Imaging fine. Shaking resolved after dose of percocet/valium. Able to sit herself up unassisted. Appears comfortable. Recommended NSAIDs for continued treatment. Requesting prescriptions for pain medication and klonopin. Discussed with pt that I do prescribe controlled medications for chronic problems such as this and recommended trying to follow back up with PCP.   12:10 AM Pt was discharged. No shaking when I spoke with her on discharge. Apparently was able to transfer herself into a wheelchair but then when got to her car she is now saying she can't get herself into it. Again with shaking in legs. Asking to speak with a hospitalist? Says she wants to get to the bottom of her symptoms. Previous admission and neurology evaluation. Shaking/tremor not acutely changed. I do not find and indication for further ED work-up today. Provided reassurance and reiterated that will not prescribe pain medication or klonopin when she again asked.   Raeford Razor, MD 08/20/13 [phone]

## 2013-08-20 ENCOUNTER — Telehealth: Payer: Self-pay

## 2013-08-20 NOTE — Telephone Encounter (Signed)
Spoke with patient in reference to her request for tylenol #3 She needs it to bridge her to her appointment at the pain clinic Which is scheduled for August 14 She also is requesting Klonopin refill #15

## 2013-08-20 NOTE — Telephone Encounter (Signed)
Pt calling to check on refill status for Tylenol #3. Please f/u with pt.

## 2013-08-21 ENCOUNTER — Telehealth: Payer: Self-pay | Admitting: Emergency Medicine

## 2013-08-21 ENCOUNTER — Other Ambulatory Visit: Payer: Self-pay | Admitting: Internal Medicine

## 2013-08-21 MED ORDER — CLONAZEPAM 1 MG PO TABS: 1.0000 mg | ORAL_TABLET | Freq: Two times a day (BID) | ORAL | Status: DC | PRN

## 2013-08-21 MED ORDER — ACETAMINOPHEN-CODEINE #3 300-30 MG PO TABS: 1.0000 | ORAL_TABLET | ORAL | Status: DC | PRN

## 2013-08-21 NOTE — Telephone Encounter (Signed)
Pt calling to check status on refill. Please f/u with pt.

## 2013-08-21 NOTE — Telephone Encounter (Signed)
Pt given prescription for Tylenol #3 30 day supply, no refills and Klonopin 1mg  tab #30 day, no refills. Pt instructed we will not refill medication any longer. She will need to be managed by pain clinic

## 2013-08-24 ENCOUNTER — Telehealth: Payer: Self-pay | Admitting: Emergency Medicine

## 2013-08-26 ENCOUNTER — Other Ambulatory Visit: Payer: Self-pay | Admitting: *Deleted

## 2013-08-26 ENCOUNTER — Ambulatory Visit: Payer: No Typology Code available for payment source | Attending: Internal Medicine

## 2013-08-26 MED ORDER — ACETAMINOPHEN-CODEINE #3 300-30 MG PO TABS: 1.0000 | ORAL_TABLET | Freq: Three times a day (TID) | ORAL | Status: DC | PRN

## 2013-09-01 ENCOUNTER — Ambulatory Visit: Payer: No Typology Code available for payment source | Attending: Internal Medicine | Admitting: Internal Medicine

## 2013-09-01 ENCOUNTER — Encounter: Payer: Self-pay | Admitting: Internal Medicine

## 2013-09-01 VITALS — BP 132/77 | HR 88 | Temp 98.3°F | Resp 16 | Ht 71.0 in | Wt 205.4 lb

## 2013-09-01 DIAGNOSIS — M25562 Pain in left knee: Secondary | ICD-10-CM

## 2013-09-01 DIAGNOSIS — R259 Unspecified abnormal involuntary movements: Secondary | ICD-10-CM | POA: Insufficient documentation

## 2013-09-01 DIAGNOSIS — K0889 Other specified disorders of teeth and supporting structures: Secondary | ICD-10-CM

## 2013-09-01 DIAGNOSIS — M25561 Pain in right knee: Secondary | ICD-10-CM

## 2013-09-01 DIAGNOSIS — M25569 Pain in unspecified knee: Secondary | ICD-10-CM | POA: Insufficient documentation

## 2013-09-01 DIAGNOSIS — F411 Generalized anxiety disorder: Secondary | ICD-10-CM | POA: Insufficient documentation

## 2013-09-01 DIAGNOSIS — Z87891 Personal history of nicotine dependence: Secondary | ICD-10-CM | POA: Insufficient documentation

## 2013-09-01 DIAGNOSIS — R251 Tremor, unspecified: Secondary | ICD-10-CM

## 2013-09-01 DIAGNOSIS — K089 Disorder of teeth and supporting structures, unspecified: Secondary | ICD-10-CM | POA: Insufficient documentation

## 2013-09-01 NOTE — Progress Notes (Signed)
Patient ID: Regina Robertson, female   DOB: 02-Sep-1980, 33 y.o.   MRN: [id number]  [id number]  HPI:  Patient presents today for pain evaluation.  She states that she fell back in December and has been having pain in her knees and left foot.  She is unsure if she was having tremors before or after the fall.  She states that she is now having falls due to the tremors.  She is currently using crutches.  She reports the tremors have gotten worse over the past few months and they are turning into jerks.  She request a referral to neurology and sports medicine for bilateral knee pain and left foot pain.    No Known Allergies Past Medical History  Diagnosis Date  . Bilateral chronic knee pain   . Thyroid disease   . Anxiety    Current Outpatient Prescriptions on File Prior to Visit  Medication Sig Dispense Refill  . acetaminophen-codeine (TYLENOL #3) 300-30 MG per tablet Take 1 tablet by mouth every 8 (eight) hours as needed for moderate pain.  60 tablet  0  . clonazePAM (KLONOPIN) 1 MG tablet Take 1 tablet (1 mg total) by mouth 2 (two) times daily as needed (spasms).  30 tablet  0  . ibuprofen (ADVIL,MOTRIN) 200 MG tablet Take 800 mg by mouth every 6 (six) hours as needed for moderate pain.      . naproxen sodium (ANAPROX) 220 MG tablet Take 440 mg by mouth 2 (two) times daily as needed (for pain).      Marland Kitchen oxyCODONE (OXY IR/ROXICODONE) 5 MG immediate release tablet Take 1 tablet (5 mg total) by mouth every 6 (six) hours as needed for moderate pain.  20 tablet  0   No current facility-administered medications on file prior to visit.   Family History  Problem Relation Age of Onset  . Diabetes Mother   . Cancer Father   . Diabetes Father   . Hypertension Father    History   Social History  . Marital Status: Single    Spouse Name: N/A    Number of Children: N/A  . Years of Education: N/A   Occupational History  . Not on file.   Social History Main Topics  . Smoking status: Former Games developer   . Smokeless tobacco: Not on file  . Alcohol Use: Yes  . Drug Use: No  . Sexual Activity: Not on file   Other Topics Concern  . Not on file   Social History Narrative  . No narrative on file   Review of Systems  Respiratory: Negative for shortness of breath and wheezing.   Cardiovascular: Negative for chest pain, palpitations and leg swelling.  Musculoskeletal: Positive for falls and myalgias.  Skin: Negative.   Neurological: Positive for dizziness and tremors. Negative for headaches.  Psychiatric/Behavioral: Positive for depression. The patient is nervous/anxious.       Objective:   Filed Vitals:   09/01/13 1703  BP: 132/77  Pulse: 88  Temp: 98.3 F (36.8 C)  Resp: 16   Physical Exam  Neck: Normal range of motion. Neck supple.  Cardiovascular: Normal rate and regular rhythm.   Pulmonary/Chest: Effort normal and breath sounds normal.  Abdominal: Soft. She exhibits no distension. There is no tenderness.  Musculoskeletal: Normal range of motion.  Neurological: She is alert.  Tremors of BLE  Skin: Skin is warm and dry. No rash noted.  Psychiatric: She has a normal mood and affect. Thought content normal.  Lab Results  Component Value Date   WBC 6.1 05/19/2013   HGB 10.6* 05/19/2013   HCT 33.3* 05/19/2013   MCV 78.0 05/19/2013   PLT 320 05/19/2013   Lab Results  Component Value Date   CREATININE 0.64 05/19/2013   BUN 10 05/19/2013   NA 138 05/19/2013   K 4.0 05/19/2013   CL 102 05/19/2013   CO2 24 05/19/2013    No results found for this basename: HGBA1C   Lipid Panel  No results found for this basename: chol, trig, hdl, cholhdl, vldl, ldlcalc       Assessment and plan:   Regina Robertson was seen today for no specified reason.  Diagnoses and associated orders for this visit:  Tremors of nervous system - Ambulatory referral to Neurology Patient may continue Klonopin for tremors  Arthralgia of both knees - Ambulatory referral to Sports Medicine  Pain,  dental - Ambulatory referral to Dentistry   Return if symptoms worsen or fail to improve.      Holland CommonsKECK, VALERIE, NP-C [facility] [phone] 09/01/2013, 5:27 PM

## 2013-09-01 NOTE — Patient Instructions (Signed)

## 2013-09-01 NOTE — Progress Notes (Signed)
Patient presents for referral to neurology  C/O twitching and jerking in legs for 6 months C/O pain since fall in left foot, both knees and lower back; rates 9/10 at present

## 2013-09-03 ENCOUNTER — Ambulatory Visit: Payer: No Typology Code available for payment source | Admitting: Family Medicine

## 2013-09-04 ENCOUNTER — Encounter: Payer: No Typology Code available for payment source | Attending: Physical Medicine & Rehabilitation

## 2013-09-04 ENCOUNTER — Ambulatory Visit (HOSPITAL_BASED_OUTPATIENT_CLINIC_OR_DEPARTMENT_OTHER): Payer: No Typology Code available for payment source | Admitting: Physical Medicine & Rehabilitation

## 2013-09-04 ENCOUNTER — Encounter: Payer: Self-pay | Admitting: Physical Medicine & Rehabilitation

## 2013-09-04 VITALS — BP 143/91 | HR 96 | Resp 14 | Wt 205.0 lb

## 2013-09-04 DIAGNOSIS — G8929 Other chronic pain: Secondary | ICD-10-CM

## 2013-09-04 DIAGNOSIS — M25569 Pain in unspecified knee: Secondary | ICD-10-CM

## 2013-09-04 DIAGNOSIS — M25562 Pain in left knee: Secondary | ICD-10-CM

## 2013-09-04 DIAGNOSIS — R251 Tremor, unspecified: Secondary | ICD-10-CM

## 2013-09-04 DIAGNOSIS — Z5181 Encounter for therapeutic drug level monitoring: Secondary | ICD-10-CM

## 2013-09-04 DIAGNOSIS — G894 Chronic pain syndrome: Secondary | ICD-10-CM | POA: Insufficient documentation

## 2013-09-04 DIAGNOSIS — Z79899 Other long term (current) drug therapy: Secondary | ICD-10-CM

## 2013-09-04 DIAGNOSIS — R259 Unspecified abnormal involuntary movements: Secondary | ICD-10-CM

## 2013-09-04 MED ORDER — PROPRANOLOL HCL ER 60 MG PO CP24: 60.0000 mg | ORAL_CAPSULE | Freq: Every day | ORAL | Status: DC

## 2013-09-04 MED ORDER — PROPRANOLOL HCL ER 60 MG PO CP24: 60.0000 mg | ORAL_CAPSULE | Freq: Every day | ORAL | Status: AC

## 2013-09-04 NOTE — Progress Notes (Signed)
Subjective:    Patient ID: Regina Robertson, female    DOB: [birth year], 33 y.o.   MRN: [id number]  HPI CC: Fall in December onto concrete. Left greater than right knee pain, low back pain Patient gives a history of taking a trash at her place in December when she fell. She has had tremors since that time. She states the tremors cause her pain. She has been evaluated for these complaints in the emergency department in March April May June and July of 2015. CT head for 05/18/2013 unremarkable Lumbar spine x-ray 05/18/2013 was unremarkable Left knee MRI 05/19/2011 unremarkable Right hip x-ray 08/18/2011 unremarkable  Has been prescribed oxycodone for this pain. Is awaiting neurology evaluation for tremors   Patient takes over-the-counter medications such as ibuprofen which are not very helpful Has not tried any physical therapy.  Has appointment with sports medicine to evaluate knee pain  Patient is attempting to get a referral from primary M.D. to see a neurologist  No further falls. No change in symptoms over time. Patient states that occasionally the right foot will shake but no symptoms in the hands. No thinking problems.  Family medical history: A cousin with multiple sclerosis, no history of tremor to her knowledge Pain Inventory Average Pain 10 Pain Right Now 10 My pain is constant, sharp, stabbing and aching  In the last 24 hours, has pain interfered with the following? General activity 10 Relation with others 10 Enjoyment of life 10 What TIME of day is your pain at its worst? all Sleep (in general) Poor  Pain is worse with: walking, bending, sitting and standing Pain improves with: rest and medication Relief from Meds: 7  Mobility walk with assistance  Function not employed: date last employed . I need assistance with the following:  meal prep, household duties and shopping  Neuro/Psych weakness numbness tremor tingling trouble  walking depression anxiety  Prior Studies Any changes since last visit?  no  Physicians involved in your care Any changes since last visit?  no   Family History  Problem Relation Age of Onset  . Diabetes Mother   . Cancer Father   . Diabetes Father   . Hypertension Father    History   Social History  . Marital Status: Single    Spouse Name: N/A    Number of Children: N/A  . Years of Education: N/A   Social History Main Topics  . Smoking status: Former Games developer  . Smokeless tobacco: None  . Alcohol Use: Yes  . Drug Use: No  . Sexual Activity: None   Other Topics Concern  . None   Social History Narrative  . None   Past Surgical History  Procedure Laterality Date  . Thyroidectomy     Past Medical History  Diagnosis Date  . Bilateral chronic knee pain   . Thyroid disease   . Anxiety    BP 143/91  Pulse 96  Resp 14  Wt 205 lb (92.987 kg)  SpO2 100%  LMP 07/24/2013  Opioid Risk Score: 2 Fall Risk Score: High Fall Risk (>13 points) (educated and given handout on fall prevention) Review of Systems  Cardiovascular: Positive for leg swelling.  Musculoskeletal: Positive for gait problem.  Neurological: Positive for tremors and numbness.       Tingling  Psychiatric/Behavioral: Positive for dysphoric mood. The patient is nervous/anxious.   All other systems reviewed and are negative.      Objective:   Physical Exam  Nursing note and vitals reviewed.  Constitutional: She is oriented to person, place, and time. She appears well-developed and well-nourished.  HENT:  Head: Normocephalic and atraumatic.  Right Ear: External ear normal.  Left Ear: External ear normal.  Eyes: Conjunctivae and EOM are normal. Pupils are equal, round, and reactive to light.  Neck: Normal range of motion.  No pain with neck range of motion  Musculoskeletal:       Left knee: She exhibits normal range of motion, no swelling, no effusion, no LCL laxity and no MCL laxity. Tenderness  found. Medial joint line, lateral joint line and patellar tendon tenderness noted.       Left ankle: She exhibits normal range of motion, no swelling, no ecchymosis and no deformity. Tenderness.  Normal range of motion bilateral upper and lower limbs  Mod diffuse tenderness at the knee. This includes borders of patella as well as joint line. No evidence of erythema or effusion  Neurological: She is alert and oriented to person, place, and time. She has normal strength.  Reflex Scores:      Tricep reflexes are 2+ on the right side and 2+ on the left side.      Bicep reflexes are 2+ on the right side and 2+ on the left side.      Brachioradialis reflexes are 2+ on the right side and 2+ on the left side.      Patellar reflexes are 2+ on the right side and 2+ on the left side.      Achilles reflexes are 2+ on the right side.  Normal sensation to pinprick in bilateral upper and lower limbs  Tremor in the left lower extremity is in inversion eversion at the ankle approximately 3 Hz  Cerebellar testing normal finger-nose-finger testing.  Patient does not tolerate reflex testing in the left ankle  Gait the patient states that she cannot put  full weight on the left lower extremity. She uses crutches or hangs on to the bed when she walks. She tends to slide her left foot on the ground and keeps her left knee straight  Psychiatric: Her affect is blunt.   Pain with very light palpation of the lumbar skin The patient also does not tolerate deep tendon reflex testing of the left ankle however there is no swelling or erythema at the left ankle no skin lesions       Assessment & Plan:  1. Chronic low back pain has pain with even minimal palpation of the low back. X-rays are negative. No signs of radiculopathy. Do not think MRIs needed at this point. May benefit from PT however no insurance   2. Left knee pain unremarkable MRI except she may have confused her patella with the fall however this was 8  months ago and should be better by now. No good explanation for her knee pain. Exam is nonspecific will followup with sports medicine on this  Given there is no good explanation for her subjective complaints and examination is unremarkable were inconsistent do not feel comfortable prescribing long-term Opiates  3. Tremor appears to be mainly confined to the left lower extremity. CT of the head was normal She does not give a good history whether this tremor started for her fall or after her fall. She does not have a family history of this. The remainder of her neurologic examination is normal. Have written Inderal LA 60 mg per day hold for pulse less than 60. Based on her vitals today they should not be an issue. Followup  with neurology recommended

## 2013-09-04 NOTE — Addendum Note (Signed)
Addended by: Doreene ElandSHUMAKER, Corin Formisano W on: 09/04/2013 01:03 PM   Modules accepted: Orders

## 2013-09-04 NOTE — Patient Instructions (Signed)
Take your pulse before you take this medication if it is less than 60 beats per minute do not take the medicine until it gets higher  Recommend neurology consultation for tremor  Recommend orthopedic consultation for left knee pain  Review your x-rays and MRI and do not have a good explanation for the left knee pain.  I do not prescribed narcotic analgesics if I cannot see a physical reason for the pain

## 2013-09-07 ENCOUNTER — Ambulatory Visit: Payer: Self-pay | Admitting: Family Medicine

## 2013-09-08 ENCOUNTER — Encounter: Payer: Self-pay | Admitting: Neurology

## 2013-09-08 ENCOUNTER — Ambulatory Visit (INDEPENDENT_AMBULATORY_CARE_PROVIDER_SITE_OTHER): Payer: Self-pay | Admitting: Neurology

## 2013-09-08 VITALS — BP 137/88 | HR 76 | Temp 99.3°F | Ht 70.0 in | Wt 199.0 lb

## 2013-09-08 DIAGNOSIS — G252 Other specified forms of tremor: Secondary | ICD-10-CM

## 2013-09-08 DIAGNOSIS — R259 Unspecified abnormal involuntary movements: Secondary | ICD-10-CM

## 2013-09-08 NOTE — Patient Instructions (Signed)
  Please remember, that any kind of tremor may be exacerbated by anxiety, anger, nervousness, excitement, dehydration, sleep deprivation, by caffeine, and low blood sugar values or blood sugar fluctuations. Some medications, especially some antidepressants and lithium can cause or exacerbate tremors. Tremors may temporarily calm down her subside with the use of a benzodiazepine such as Valium or related medications and with alcohol. Be aware however that drinking alcohol is not an approved treatment or appropriate treatment for tremor control and long-term use of benzodiazepines such as Valium, lorazepam, alprazolam, or clonazepam can cause habit formation, physical and psychological addiction.  

## 2013-09-08 NOTE — Progress Notes (Signed)
Subjective:    Patient ID: Regina Robertson is a 33 y.o. female.  HPI    Regina Foley, MD, PhD [facility] [address], [address] [address] [place], [place] [address]  Dear Regina Robertson,  Also your patient, Regina Robertson, upon your kind request in my neurologic clinic today for initial consultation of her tremors. The patient is unaccompanied today. As you know, Regina Robertson is a 33 year old right-handed woman with an underlying medical history of thyroid disease, anxiety and chronic knee pain, who reports tremors in her lower extremities as well as abnormal jerking in her lower extremities. Symptoms started in December after a fall onto concrete. She has been seen multiple times in the emergency room for pain. She had x-rays to her left knee, right hip, lumbar spine and had CTH wo contrast, all of which were reported as unremarkable. Pain is reported in her lower back as well as both knees. On 09/04/2013 she saw Dr. Wynn Banker for her knee and back issues. She was started on long-acting propranolol 60 mg daily for her tremor control symptomatically. She reports, that it made it worse and she stopped taking it. She had tingling with it. She ran out of clonazepam. She sleeps poorly. She had taken oxycodone, but has finished those as well.  She was at [facility] for 3 days in December and has been to the ER multiple times. She had seen psychiatry in the hospital in 4/15 an outpatient referral to psychiatry for recommended.    Her Past Medical History Is Significant For: Past Medical History  Diagnosis Date  . Bilateral chronic knee pain   . Thyroid disease   . Anxiety     Her Past Surgical History Is Significant For: Past Surgical History  Procedure Laterality Date  . Thyroidectomy  2007    Her Family History Is Significant For: Family History  Problem Relation Age of Onset  . Cancer Father   . Diabetes Father   . Hypertension Father   . Diabetes Other     Her Social  History Is Significant For: History   Social History  . Marital Status: Single    Spouse Name: N/A    Number of Children: 0  . Years of Education: BS/assoc.   Occupational History  .      unemployed   Social History Main Topics  . Smoking status: Former Games developer  . Smokeless tobacco: Never Used     Comment: electronic cigs.since 03/2012  . Alcohol Use: Yes     Comment: occas.  . Drug Use: No  . Sexual Activity: None   Other Topics Concern  . None   Social History Narrative   Patient is right handed, resides alone    Her Allergies Are:  No Known Allergies:   Her Current Medications Are:  Outpatient Encounter Prescriptions as of 09/08/2013  Medication Sig  . clonazePAM (KLONOPIN) 1 MG tablet Take 1 tablet (1 mg total) by mouth 2 (two) times daily as needed (spasms).  Marland Kitchen ibuprofen (ADVIL,MOTRIN) 200 MG tablet Take 800 mg by mouth every 6 (six) hours as needed for moderate pain.  Marland Kitchen oxyCODONE (OXY IR/ROXICODONE) 5 MG immediate release tablet Take 1 tablet (5 mg total) by mouth every 6 (six) hours as needed for moderate pain.  Marland Kitchen oxyCODONE-acetaminophen (PERCOCET/ROXICET) 5-325 MG per tablet Take by mouth.  . propranolol ER (INDERAL LA) 60 MG 24 hr capsule Take 1 capsule (60 mg total) by mouth daily.  . [DISCONTINUED] acetaminophen-codeine (TYLENOL #3) 300-30 MG  per tablet Take 1 tablet by mouth every 8 (eight) hours as needed for moderate pain.  . [DISCONTINUED] naproxen sodium (ANAPROX) 220 MG tablet Take 440 mg by mouth 2 (two) times daily as needed (for pain).  :   Review of Systems:  Out of a complete 14 point review of systems, all are reviewed and negative with the exception of these symptoms as listed below:   Review of Systems  Constitutional: Positive for fever, chills and fatigue.  Eyes: Positive for pain.       Both eyes having pain  Musculoskeletal:       Joint pain ,aching muscles  Neurological: Positive for tremors and weakness.       Insomnia   Hematological: Bruises/bleeds easily.       Anemia  Psychiatric/Behavioral:       Anxiety,not enough sleep,decreased energy,disinterest in activities, racing thoughts    Objective:  Neurologic Exam  Physical Exam Physical Examination:   Filed Vitals:   09/08/13 0959  BP: 137/88  Pulse: 76  Temp: 99.3 F (37.4 C)    General Examination: The patient is a very pleasant 33 y.o. female in no acute distress. She appears well-developed and well-nourished and well groomed. She is situated in a clinic WC.   HEENT: Normocephalic, atraumatic, pupils are equal, round and reactive to light and accommodation. Funduscopic exam is normal with sharp disc margins noted. Extraocular tracking is good without limitation to gaze excursion or nystagmus noted. Normal smooth pursuit is noted. Hearing is grossly intact. Tympanic membranes are clear bilaterally. Face is symmetric with normal facial animation and normal facial sensation. Speech is clear with no dysarthria noted. There is no hypophonia. There is no lip, neck/head, jaw or voice tremor. Neck is supple with full range of passive and active motion. There are no carotid bruits on auscultation. Oropharynx exam reveals: mild mouth dryness, adequate dental hygiene and mild airway crowding. Mallampati is class II. Tongue protrudes centrally and palate elevates symmetrically.    Chest: Clear to auscultation without wheezing, rhonchi or crackles noted.  Heart: S1+S2+0, regular and normal without murmurs, rubs or gallops noted.   Abdomen: Soft, non-tender and non-distended with normal bowel sounds appreciated on auscultation.  Extremities: There is no pitting edema in the distal lower extremities bilaterally. Pedal pulses are intact.  Skin: Warm and dry without trophic changes noted. There are no varicose veins.  Musculoskeletal: exam reveals no obvious joint deformities, tenderness or joint swelling or erythema, but she reports L knee tenderness.    Neurologically:  Mental status: The patient is awake, alert and oriented in all 4 spheres. Her immediate and remote memory, attention, language skills and fund of knowledge are appropriate. There is no evidence of aphasia, agnosia, apraxia or anomia. Speech is clear with normal prosody and enunciation. Thought process is linear. Mood is constricted and affect is blunted.  Cranial nerves II - XII are as described above under HEENT exam. In addition: shoulder shrug is normal with equal shoulder height noted. Motor exam: Normal bulk, strength and tone is noted in the upper extremities and she has variable strength in the lower extremities particularly in the left lower extremity.. There is no drift, or rebound. There is no resting tremor, except for intermittent variable trembling in the left lower extremity. The tremor has a variable amplitude and variable frequency and is distractible especially with specific tasks performed in the right lower extremity. It completely stops with right foot tapping. There is a bilateral upper extremity postural and  action tremor, which is minimal in degree. There tremor frequency is fairly fast and the amplitude is small. On Archimedes spiral drawing there is minimal tremulousness noted. Handwriting is Not tremulous and legible. There is no evidence of micrographia.  Romberg is negative. Reflexes are 2+ throughout. Babinski: Toes are flexor bilaterally. Fine motor skills and coordination: intact with normal finger taps, normal hand movements, normal rapid alternating patting, normal foot taps and normal foot agility.  Cerebellar testing: No dysmetria or intention tremor on finger to nose testing. There is no truncal or gait ataxia.  Sensory exam: intact to light touch, pinprick, vibration, temperature sense in the upper and lower extremities.  Gait, station and balance: She is able to push herself up from the transport chair and stands leaning to the right. She bears weight  on the right leg and has a variable degree of trembling in the left leg. She says she cannot walk. I did not have her try. She did not bring her crutches.                Assessment and plan:   In summary, Catherine Utley is a very pleasant 33 y.o.-year old female with a history of trembling. This is most pronounced in the left lower extremity. Her tremor has a variable character and is distractible. She is reassured that there is no other focality on exam and her tremor is not Parkinsonian in nature. She is advised that sometimes tremors can be seen as a response to stress. She is requesting a refill on clonazepam and I advised her that there is no specific treatment for tremors and clonazepam this not something I would prescribe for this. Nevertheless, I do agree that she would benefit from evaluation of her left knee pain. She apparently has an appointment with orthopedics next week. She may benefit from physical therapy as well. She is advised to discuss with you a referral to psychiatry as was recommended during her hospitalization in April. She says that she was told in the hospital by the neurologist that this was not a tremor but it was stress. She is advised that I would not recommend from my end of things any additional testing. I reviewed her head CT and I advised her about the findings. She was reassured that she had a normal head CT and otherwise has a normal exam. She is furthermore advised that I would not be prescribing any narcotic pain medication. I asked her to see what orthopedics has to say. Maybe they would recommend further testing and other symptomatic treatment that may help her pain. I will see her back on an as-needed basis. I recommended that she discuss with you a referral to outpatient psychiatry and consider physical therapy which may be something that the orthopedic doctor may also recommend.  Thank you very much for allowing me to participate in the care of this nice patient. If  I can be of any further assistance to you please do not hesitate to call me at [phone].  Sincerely,   Regina Foley, MD, PhD

## 2013-09-16 ENCOUNTER — Encounter: Payer: Self-pay | Admitting: Sports Medicine

## 2013-09-16 ENCOUNTER — Ambulatory Visit (INDEPENDENT_AMBULATORY_CARE_PROVIDER_SITE_OTHER): Payer: Self-pay | Admitting: Sports Medicine

## 2013-09-16 VITALS — BP 137/86 | Ht 71.0 in | Wt 200.0 lb

## 2013-09-16 DIAGNOSIS — M25562 Pain in left knee: Secondary | ICD-10-CM

## 2013-09-16 DIAGNOSIS — R259 Unspecified abnormal involuntary movements: Secondary | ICD-10-CM

## 2013-09-16 DIAGNOSIS — M25569 Pain in unspecified knee: Secondary | ICD-10-CM

## 2013-09-16 DIAGNOSIS — R251 Tremor, unspecified: Secondary | ICD-10-CM

## 2013-09-16 MED ORDER — MELOXICAM 15 MG PO TABS: 15.0000 mg | ORAL_TABLET | Freq: Every day | ORAL | Status: DC

## 2013-09-16 NOTE — Assessment & Plan Note (Addendum)
Chronic ongoing symptoms. Consistent with disuse of the left knee. Referral to physical therapy as well as short course of anti-inflammatories started. She is significantly and her everyday activities is to continue using crutches until seen again. I suspect there is a mainly psychogenic cause of this but given the hyper reflexia noted on exam if this is persistent would consider further evaluation with MRI of the lumbar spine. Also we'll plan to measure thigh diameter a followup to ensure no significant denervation atrophy occurring. We did discuss potentially using nitroglycerin to help with the fat pad impingement however she has a significant history of migraines candidate for this.  Could consider US Guided CSI into fat pad if not improved with Oral NSAIDs  I have encouraged her to followup with her PCP so they may address her ongoing mood and psychiatric issues. Psychiatric evaluation would be warranted however her lack of insurance is limiting. Could consider empiric SSRI versus SNRI but will defer to PCP.

## 2013-09-16 NOTE — Progress Notes (Signed)
Regina Robertson - 33 y.o. female MRN [id number]  Date of birth: [birth year]  SUBJECTIVE:  Including CC & ROS.  The patient is referred by Ambrose Finland, NP for evaluation of: Left Knee Pain & Low Back Pain: Patient reports greater than 6 months of worsening left knee pain that worsened after a fall in December. She's had multiple recurrent falls since that time. 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 This is felt to be primarily psychogenic in nature she's been unable to followup with psychiatry due to insurance issues. She is on any mood stabilizers. She's had significant family stresses.  Regarding her left knee pain she has had difficulty with ambulating is requiring crutches. Reports her leg will frequently give way on her but she denies any clicking, locking or other mechanical symptoms. She has inability to stand with her knee bent. She is undergone an MRI as below. No therapy, regular medications or bracing has been tried.  Low back pain that is low lumbar/SI in nature. Nonradiating. No fevers, chills, recent weight loss. No change in bowel or bladder.   HISTORY: Past Medical, Surgical, Social, and Family History Reviewed & Updated per EMR. Pertinent Historical Findings include: Tremor, anemia, hospitalization for depression with inpatient psychiatric evaluation recommended however discharge prior to this due to stabilization. Thyroid disease status post thyroidectomy (last TSH 05/18/2013 was 0.898) Former smoker, social alcohol use.  DATA REVIEWED: PM&R Office visit - 09/04/13 -  Neurology Office Visit - 09/08/13 -  Psychiatry Consult - 05/19/13 [facility] D/C Summary - 05/19/13 -   X-ray lumbar spine 05/19/2013 - no acute fracture or dislocation. Incomplete fusion of S1.  MRI Left Knee - 05/18/13 - Slight lateral patellar subluxation.  Fat Pad  Impingement anterior/lateral. Slight extrusion of anterior horn of medial meniscus without tear.  PHYSICAL EXAM:  VS: BP:137/86 mmHg  HR: bpm  TEMP: ( )  RESP:   HT:5\' 11"  (180.3 cm)   WT:200 lb (90.719 kg)  BMI:28 PHYSICAL EXAM: GENERAL:  Adult African American female. In no discomfort; no respiratory distress  PSYCH:  alert and appropriate, good insight  Left knee Exam:        Gen/Palp:  Normal-appearing left knee. Markedly tender over the distal lateral portion of the patella. Generalized tenderness over medial and lateral joint line . No tenderness over the quadriceps tendon.      ROM/MST:  Full passive range of motion. Active extension limited by 30 due to weakness. Quad strength strength 3/5. Positive Waddell's sign with initial testing able to lift foot approximately 2 inches off the table when this was pointed out.                     NV:  Sensation is intact to light touch. Ankle plantar and dorsiflexion strength 5+ out of 5. Hamstring strength 4/5.                 Tests:  Knee is stable to anterior posterior drawer, normal Lachman's.  Stable to varus valgus strain. Positive J sign.  Back  Exam:        Gen/Palp:  To palpation over bilateral SI joints as well as midline on S1. No marked muscle spasm appreciated. No skin changes.  NV:  Lower extremity DTRs 2+ over 4 with the exception of left patellar reflexes 3+/4, minimally fatigable. Trace suprapatellar reflex.                 Tests:  Negative straight leg raise Limited MSK Ultrasound Exam Of left knee:          Findings:   Small hypoechoic change over the anterior lateral fat pad just deep to the origin of the patellar tendon. There is a small amount spurring of the patella at this point. No prepatellar bursitis or changes of the tendon. No effusion noted extensor mechanism intact.     Impression:   The above findings are consistent with fat pad impingement.    ASSESSMENT & PLAN: See problem based charting &  AVS for pt instructions.

## 2013-09-23 ENCOUNTER — Telehealth: Payer: Self-pay | Admitting: Internal Medicine

## 2013-09-23 ENCOUNTER — Encounter: Payer: Self-pay | Admitting: *Deleted

## 2013-09-23 ENCOUNTER — Other Ambulatory Visit: Payer: Self-pay | Admitting: *Deleted

## 2013-09-23 MED ORDER — CLONAZEPAM 1 MG PO TABS: 1.0000 mg | ORAL_TABLET | Freq: Two times a day (BID) | ORAL | Status: AC | PRN

## 2013-09-23 NOTE — Progress Notes (Unsigned)
Patient here today with c/o constant leg tremors and arm tremors from a fall. Patient states she has been calling for a refill on Klonopin. Per PCP note patient can continue to take Klonopin for tremors. Consulted with Dr. Hyman Hopes and verbal order received for refill on medication. Do you want to put continuous refills on this medication. Patient states she went to Neurology and felt that she did not get any help.Annamaria Helling, RN

## 2013-09-23 NOTE — Progress Notes (Signed)
No this will not be a continuous medication.

## 2013-09-23 NOTE — Telephone Encounter (Signed)
Patient needs medication refill for Klonopin 1 mg to be sent at [facility] Please f/u with Patient

## 2013-10-20 ENCOUNTER — Telehealth: Payer: Self-pay | Admitting: Internal Medicine

## 2013-10-20 NOTE — Telephone Encounter (Signed)
Pt calling for clonazePAM (KLONOPIN) 1 MG tablet refill.  Please f/u with pt.

## 2013-10-20 NOTE — Telephone Encounter (Signed)
Pt requesting medication refill. Last given 09/23/13 #60,no refills  Pt calling for clonazePAM (KLONOPIN) 1 MG tablet refill. Please f/u with pt

## 2013-10-20 NOTE — Telephone Encounter (Signed)
This is not a chronic refill medication.  She will need to contact neurology

## 2013-10-21 ENCOUNTER — Telehealth: Payer: Self-pay | Admitting: Emergency Medicine

## 2013-10-21 NOTE — Telephone Encounter (Signed)
Pt informed provider will not refill medication Klonopin. Informed to f/u with Neurology

## 2013-10-21 NOTE — Telephone Encounter (Signed)
Pt informed provider will not refill medication request for Clonidine .she will need to f/u with Neurology

## 2014-01-18 ENCOUNTER — Emergency Department (HOSPITAL_COMMUNITY)
Admission: EM | Admit: 2014-01-18 | Discharge: 2014-01-18 | Disposition: A | Discharge status: Home / Self Care | Payer: Self-pay | Attending: Emergency Medicine | Admitting: Emergency Medicine

## 2014-01-18 ENCOUNTER — Emergency Department (HOSPITAL_COMMUNITY): Payer: Self-pay

## 2014-01-18 ENCOUNTER — Encounter (HOSPITAL_COMMUNITY): Payer: Self-pay | Admitting: Emergency Medicine

## 2014-01-18 DIAGNOSIS — Z008 Encounter for other general examination: Secondary | ICD-10-CM | POA: Insufficient documentation

## 2014-01-18 DIAGNOSIS — F32A Depression, unspecified: Secondary | ICD-10-CM

## 2014-01-18 DIAGNOSIS — F419 Anxiety disorder, unspecified: Secondary | ICD-10-CM | POA: Insufficient documentation

## 2014-01-18 DIAGNOSIS — Z87891 Personal history of nicotine dependence: Secondary | ICD-10-CM | POA: Insufficient documentation

## 2014-01-18 DIAGNOSIS — Z7982 Long term (current) use of aspirin: Secondary | ICD-10-CM | POA: Insufficient documentation

## 2014-01-18 DIAGNOSIS — G8929 Other chronic pain: Secondary | ICD-10-CM | POA: Insufficient documentation

## 2014-01-18 DIAGNOSIS — G252 Other specified forms of tremor: Secondary | ICD-10-CM

## 2014-01-18 DIAGNOSIS — Z8639 Personal history of other endocrine, nutritional and metabolic disease: Secondary | ICD-10-CM | POA: Insufficient documentation

## 2014-01-18 DIAGNOSIS — Z79899 Other long term (current) drug therapy: Secondary | ICD-10-CM | POA: Insufficient documentation

## 2014-01-18 DIAGNOSIS — F329 Major depressive disorder, single episode, unspecified: Secondary | ICD-10-CM | POA: Insufficient documentation

## 2014-01-18 DIAGNOSIS — R251 Tremor, unspecified: Secondary | ICD-10-CM | POA: Insufficient documentation

## 2014-01-18 LAB — CBC
HEMATOCRIT: 38.6 % (ref 36.0–46.0)
Hemoglobin: 12.7 g/dL (ref 12.0–15.0)
MCH: 25.5 pg — ABNORMAL LOW (ref 26.0–34.0)
MCHC: 32.9 g/dL (ref 30.0–36.0)
MCV: 77.4 fL — AB (ref 78.0–100.0)
Platelets: 394 10*3/uL (ref 150–400)
RBC: 4.99 MIL/uL (ref 3.87–5.11)
RDW: 16.2 % — ABNORMAL HIGH (ref 11.5–15.5)
WBC: 7.9 10*3/uL (ref 4.0–10.5)

## 2014-01-18 LAB — COMPREHENSIVE METABOLIC PANEL
ALT: 18 U/L (ref 0–35)
ANION GAP: 10 (ref 5–15)
AST: 15 U/L (ref 0–37)
Albumin: 4.1 g/dL (ref 3.5–5.2)
Alkaline Phosphatase: 66 U/L (ref 39–117)
BUN: 11 mg/dL (ref 6–23)
CALCIUM: 9.4 mg/dL (ref 8.4–10.5)
CO2: 20 mmol/L (ref 19–32)
CREATININE: 0.68 mg/dL (ref 0.50–1.10)
Chloride: 108 mEq/L (ref 96–112)
GFR calc non Af Amer: >90 mL/min (ref >90)
GLUCOSE: 107 mg/dL — AB (ref 70–99)
Potassium: 3.8 mmol/L (ref 3.5–5.1)
Sodium: 138 mmol/L (ref 135–145)
TOTAL PROTEIN: 7.9 g/dL (ref 6.0–8.3)
Total Bilirubin: 0.6 mg/dL (ref 0.3–1.2)

## 2014-01-18 LAB — ACETAMINOPHEN LEVEL: Acetaminophen (Tylenol), Serum: <10 ug/mL — ABNORMAL LOW (ref 10–30)

## 2014-01-18 LAB — SALICYLATE LEVEL: Salicylate Lvl: <4 mg/dL (ref 2.8–20.0)

## 2014-01-18 LAB — ETHANOL: Alcohol, Ethyl (B): <5 mg/dL (ref 0–9)

## 2014-01-18 MED ORDER — ONDANSETRON HCL 4 MG PO TABS: 4.0000 mg | ORAL_TABLET | Freq: Three times a day (TID) | ORAL | Status: DC | PRN

## 2014-01-18 MED ORDER — LORAZEPAM 1 MG PO TABS
2.0000 mg | ORAL_TABLET | Freq: Once | ORAL | Status: AC
  Administered 2014-01-18: 2 mg via ORAL
  Filled 2014-01-18: qty 2

## 2014-01-18 MED ORDER — NICOTINE 21 MG/24HR TD PT24
21.0000 mg | MEDICATED_PATCH | Freq: Every day | TRANSDERMAL | Status: DC
  Administered 2014-01-18: 21 mg via TRANSDERMAL
  Filled 2014-01-18: qty 1

## 2014-01-18 MED ORDER — HYDROCODONE-ACETAMINOPHEN 5-325 MG PO TABS
1.0000 | ORAL_TABLET | Freq: Once | ORAL | Status: AC
  Administered 2014-01-18: 1 via ORAL
  Filled 2014-01-18: qty 1

## 2014-01-18 MED ORDER — ACETAMINOPHEN 325 MG PO TABS: 650.0000 mg | ORAL_TABLET | ORAL | Status: DC | PRN

## 2014-01-18 MED ORDER — ZOLPIDEM TARTRATE 5 MG PO TABS: 5.0000 mg | ORAL_TABLET | Freq: Every evening | ORAL | Status: DC | PRN

## 2014-01-18 MED ORDER — ALUM & MAG HYDROXIDE-SIMETH 200-200-20 MG/5ML PO SUSP: 30.0000 mL | ORAL | Status: DC | PRN

## 2014-01-18 MED ORDER — LORAZEPAM 1 MG PO TABS: 1.0000 mg | ORAL_TABLET | Freq: Three times a day (TID) | ORAL | Status: DC | PRN

## 2014-01-18 MED ORDER — IBUPROFEN 400 MG PO TABS: 600.0000 mg | ORAL_TABLET | Freq: Three times a day (TID) | ORAL | Status: DC | PRN

## 2014-01-18 NOTE — ED Notes (Signed)
Pt continues in mri 

## 2014-01-18 NOTE — ED Notes (Signed)
Patient has returned from mri

## 2014-01-18 NOTE — ED Provider Notes (Signed)
CSN: [id number]     Arrival date & time 01/18/14  0504 History   First MD Initiated Contact with Patient 01/18/14 0805     Chief Complaint  Patient presents with  . Tremors  . Medical Clearance     (Consider location/radiation/quality/duration/timing/severity/associated sxs/prior Treatment) HPI Patient presents to the emergency department with tremor and depression with anxiety.  The patient states that she has had this tremor versus she fell last year.  The patient states that this makes her life very difficult and this makes her more depressed.  Patient denies suicidal or homicidal ideation.  Patient denies chest pain, shortness of breath, hallucinations, nausea, vomiting, diarrhea, dizziness, headache, blurred vision, back pain, neck pain, fever, cough, rhinorrhea, sore throat, states that this is make her condition better or worse. Past Medical History  Diagnosis Date  . Bilateral chronic knee pain   . Thyroid disease   . Anxiety    Past Surgical History  Procedure Laterality Date  . Thyroidectomy  2007   Family History  Problem Relation Age of Onset  . Cancer Father   . Diabetes Father   . Hypertension Father   . Diabetes Other    History  Substance Use Topics  . Smoking status: Former Games developermoker  . Smokeless tobacco: Never Used     Comment: electronic cigs.since 03/2012  . Alcohol Use: Yes     Comment: occas.   OB History    No data available     Review of Systems All other systems negative except as documented in the HPI. All pertinent positives and negatives as reviewed in the HPI.   Allergies  Review of patient's allergies indicates no known allergies.  Home Medications   Prior to Admission medications   Medication Sig Start Date End Date Taking? Authorizing Provider  acetaminophen (TYLENOL) 325 MG tablet Take 325 mg by mouth every 6 (six) hours as needed for mild pain.   Yes Historical Provider, MD  aspirin 325 MG tablet Take 325 mg by mouth every 6 (six)  hours as needed for mild pain.   Yes Historical Provider, MD  ibuprofen (ADVIL,MOTRIN) 200 MG tablet Take 800 mg by mouth every 6 (six) hours as needed for moderate pain.   Yes Historical Provider, MD  oxyCODONE (OXY IR/ROXICODONE) 5 MG immediate release tablet Take 1 tablet (5 mg total) by mouth every 6 (six) hours as needed for moderate pain. 05/20/13  Yes Penny Piarlando Vega, MD  clonazePAM (KLONOPIN) 1 MG tablet Take 1 tablet (1 mg total) by mouth 2 (two) times daily as needed (spasms). Patient not taking: Reported on 01/18/2014 09/23/13   Quentin Angstlugbemiga E Jegede, MD  meloxicam (MOBIC) 15 MG tablet Take 1 tablet (15 mg total) by mouth daily. Patient not taking: Reported on 01/18/2014 09/16/13   Andrena MewsMichael D Rigby, DO  oxyCODONE-acetaminophen (PERCOCET/ROXICET) 5-325 MG per tablet Take by mouth.    Historical Provider, MD  propranolol ER (INDERAL LA) 60 MG 24 hr capsule Take 1 capsule (60 mg total) by mouth daily. Patient not taking: Reported on 01/18/2014 09/04/13   Erick ColaceAndrew E Kirsteins, MD   BP 139/80 mmHg  Pulse 108  Temp(Src) 98 F (36.7 C) (Oral)  Resp 17  Ht 5\' 11"  (1.803 m)  Wt 200 lb (90.719 kg)  BMI 27.91 kg/m2  SpO2 100% Physical Exam  Constitutional: She is oriented to person, place, and time. She appears well-developed and well-nourished. No distress.  HENT:  Head: Normocephalic and atraumatic.  Mouth/Throat: Oropharynx is clear and moist.  Eyes: Pupils are equal, round, and reactive to light.  Neck: Normal range of motion. Neck supple.  Cardiovascular: Normal rate, regular rhythm and normal heart sounds.  Exam reveals no gallop and no friction rub.   No murmur heard. Pulmonary/Chest: Effort normal and breath sounds normal. No respiratory distress.  Neurological: She is alert and oriented to person, place, and time. She has normal strength. No sensory deficit. She exhibits normal muscle tone. Coordination normal. GCS eye subscore is 4. GCS verbal subscore is 5. GCS motor subscore is 6.   Patient does have bilateral tremor in her legs and arms.  The patient states that it feels worse on the right  Skin: Skin is warm and dry.  Nursing note and vitals reviewed.   ED Course  Procedures (including critical care time) Labs Review Labs Reviewed  ACETAMINOPHEN LEVEL - Abnormal; Notable for the following:    Acetaminophen (Tylenol), Serum <10.0 (*)    All other components within normal limits  CBC - Abnormal; Notable for the following:    MCV 77.4 (*)    MCH 25.5 (*)    RDW 16.2 (*)    All other components within normal limits  COMPREHENSIVE METABOLIC PANEL - Abnormal; Notable for the following:    Glucose, Bld 107 (*)    All other components within normal limits  ETHANOL  SALICYLATE LEVEL  URINE RAPID DRUG SCREEN (HOSP PERFORMED)    Imaging Review Mr Lodema PilotBrain W Wo Contrast  01/18/2014   CLINICAL DATA:  Tremors.  Falling.  Weakness.  EXAM: MRI HEAD WITHOUT AND WITH CONTRAST  TECHNIQUE: Multiplanar, multiecho pulse sequences of the brain and surrounding structures were obtained without and with intravenous contrast.  CONTRAST:  20 cc MultiHance  COMPARISON:  Head CT 05/18/2013  FINDINGS: The brain has a normal appearance on all pulse sequences without evidence of malformation, atrophy, old or acute infarction, mass lesion, hemorrhage, hydrocephalus or extra-axial collection. No pituitary mass. No fluid in the sinuses, middle ears or mastoids. No skull or skullbase lesion. There is flow in the major vessels at the base of the brain. Major venous sinuses show flow. After contrast administration, no abnormal enhancement occurs.  IMPRESSION: Normal examination.   Electronically Signed   By: Paulina FusiMark  Shogry M.D.   On: 01/18/2014 11:24    Patient was evaluated by TTS and will be given outpatient referrals.  The patient will also be given an outpatient referral to a movement specialist in neurology patient is advised plan and all questions were answered    Carlyle DollyChristopher W Orva Riles,  PA-C 01/18/14 1438  Enid SkeensJoshua M Zavitz, MD 01/18/14 [phone]

## 2014-01-18 NOTE — ED Notes (Addendum)
Patient with history of tremors after a fall and now getting worse.  Patient has not been able to take her Klonapin due to not being able to get any more due to no insurance.  Patient states that her tremors are getting worse and couldn't stand it any longer so she came to the ED.  Patient states that she does wants to speak with someone about the stress of the tremors in her life.  She denies any SI/HI at this time, but does want to speak to someone.
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
## 2014-01-18 NOTE — BH Assessment (Signed)
Writer informed Kristin C of the consult.  

## 2014-01-18 NOTE — ED Notes (Signed)
Patient reports that she was "fine" until she fell and injured her knee last December. She states that shortly after the fall she began with "shakes". States they started in her toes and progressed up to her legs. States she has fallen many times as result of her shakes. States that the tremors have progressed to her arms and she sometimes has twitches in her face and her "teeth will chatter".  States she uses a sit down walker at home to move about. States she is afraid to try to walk to the restroom . She has crutches but states she often falls using them too, states she has not had an mri of anything but her knee since onset of the symptoms. States she did see a neurologist in April or so but they didn't do anything for her. States "it is driving me crazy, i dont want to live like this". She states the neurologist think this is all stress related. She states that she used to be on klonopin but her doctors quit prescribing it in September. States "i hurt all over from shaking".

## 2014-01-18 NOTE — ED Notes (Signed)
At this time, pt requesting pain medication. Pt aware that she has prn orders for ibuprofen and tylenol, pt states "i need something stronger than that."

## 2014-01-18 NOTE — ED Notes (Signed)
BH HAS SPOKEN TO [employer] ABOUT PLAN FOR PATIENT.

## 2014-01-18 NOTE — Discharge Instructions (Signed)
He will need to see the specialist provided.  Return here as needed.  Your MRI was normal

## 2015-06-19 IMAGING — CR DG LUMBAR SPINE 2-3V
3 series · 3 of 3 positions shown · non-contrast
Comparison: None.

CLINICAL DATA: Status post multiple falls.  Lower back pain.

EXAM:
LUMBAR SPINE - 2-3 VIEW

[t l-spine a.p.]
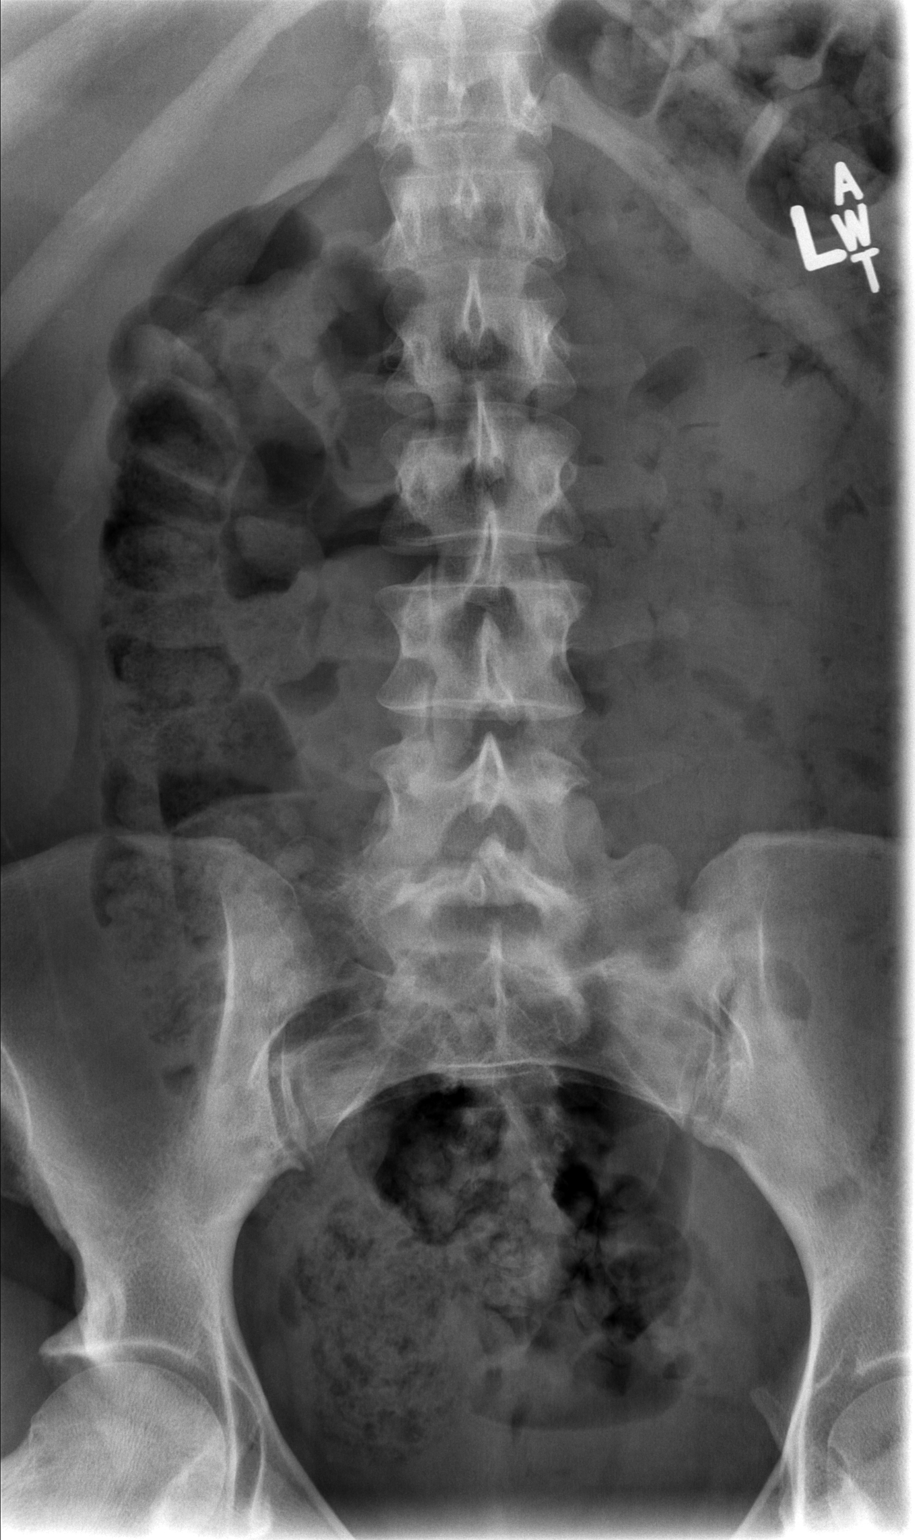

[t l-spine lat]
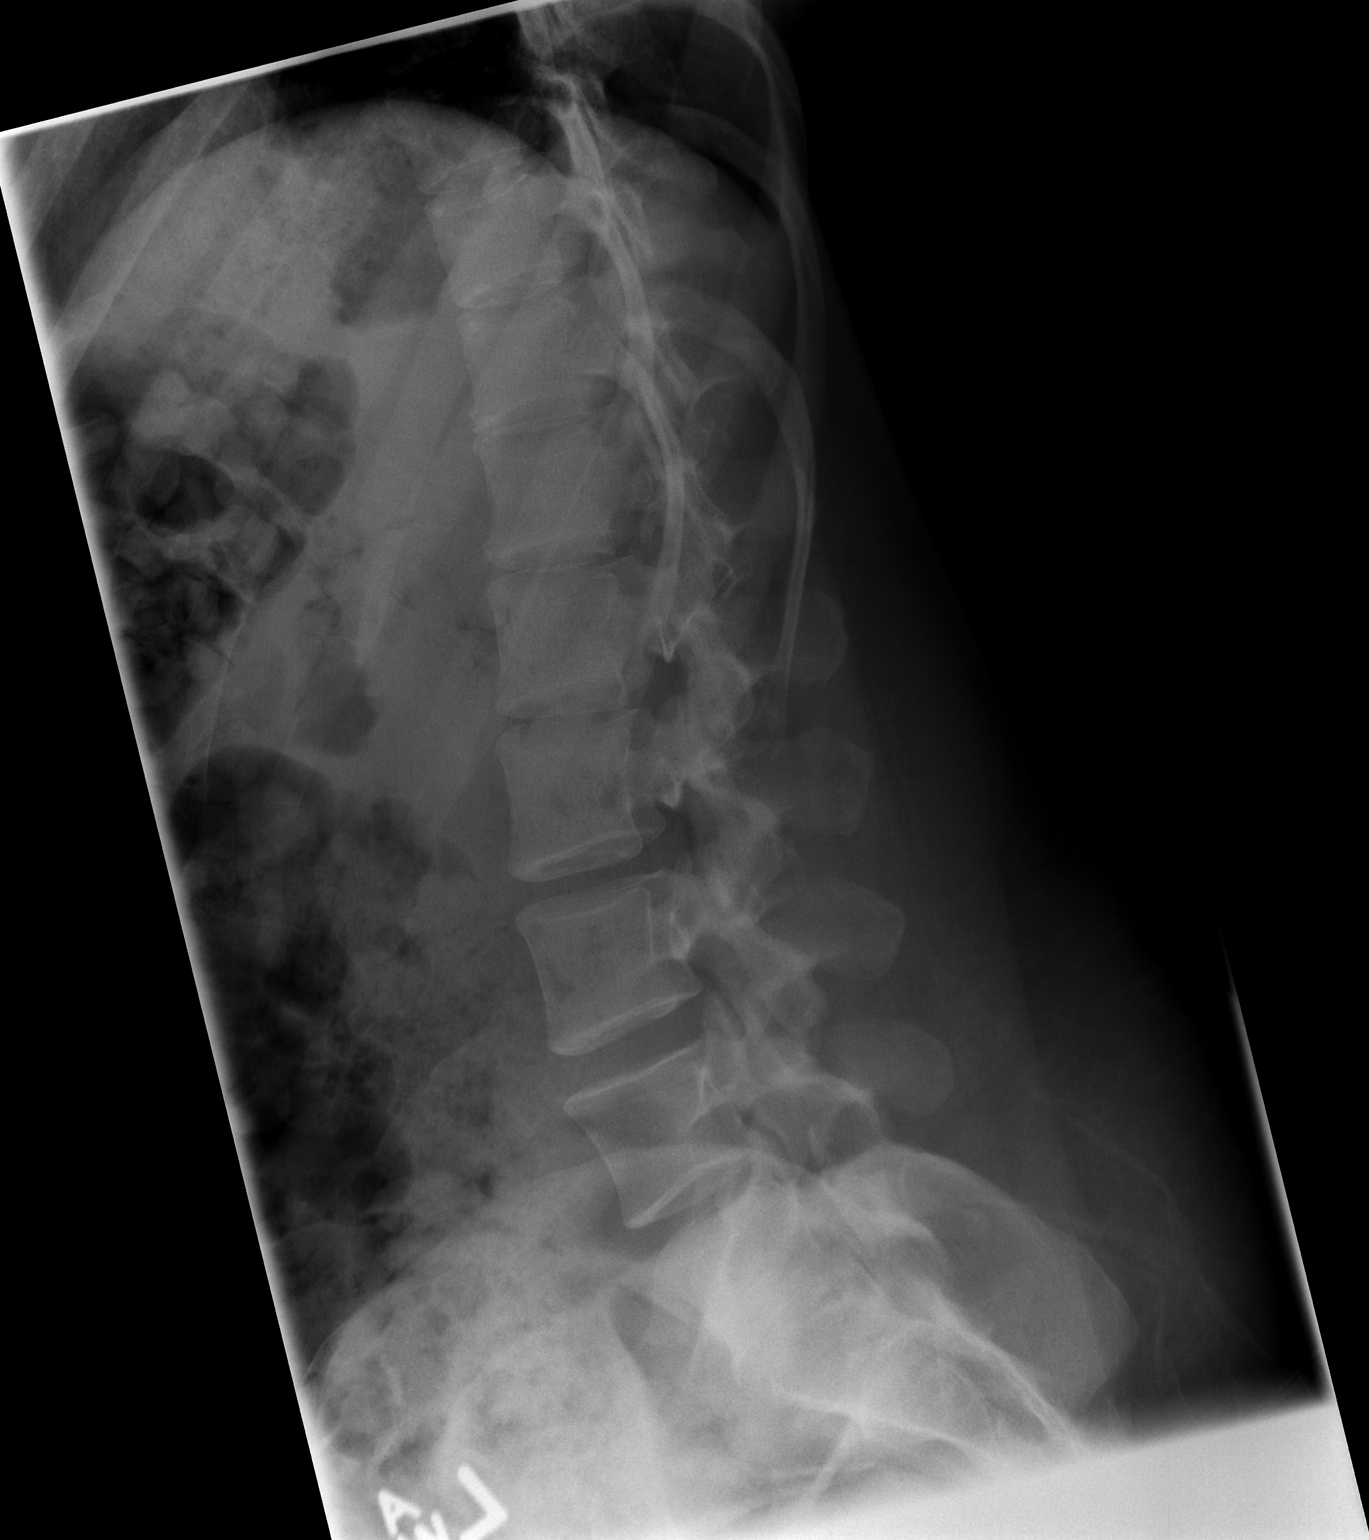

[t l-spine l5-s1 spot]
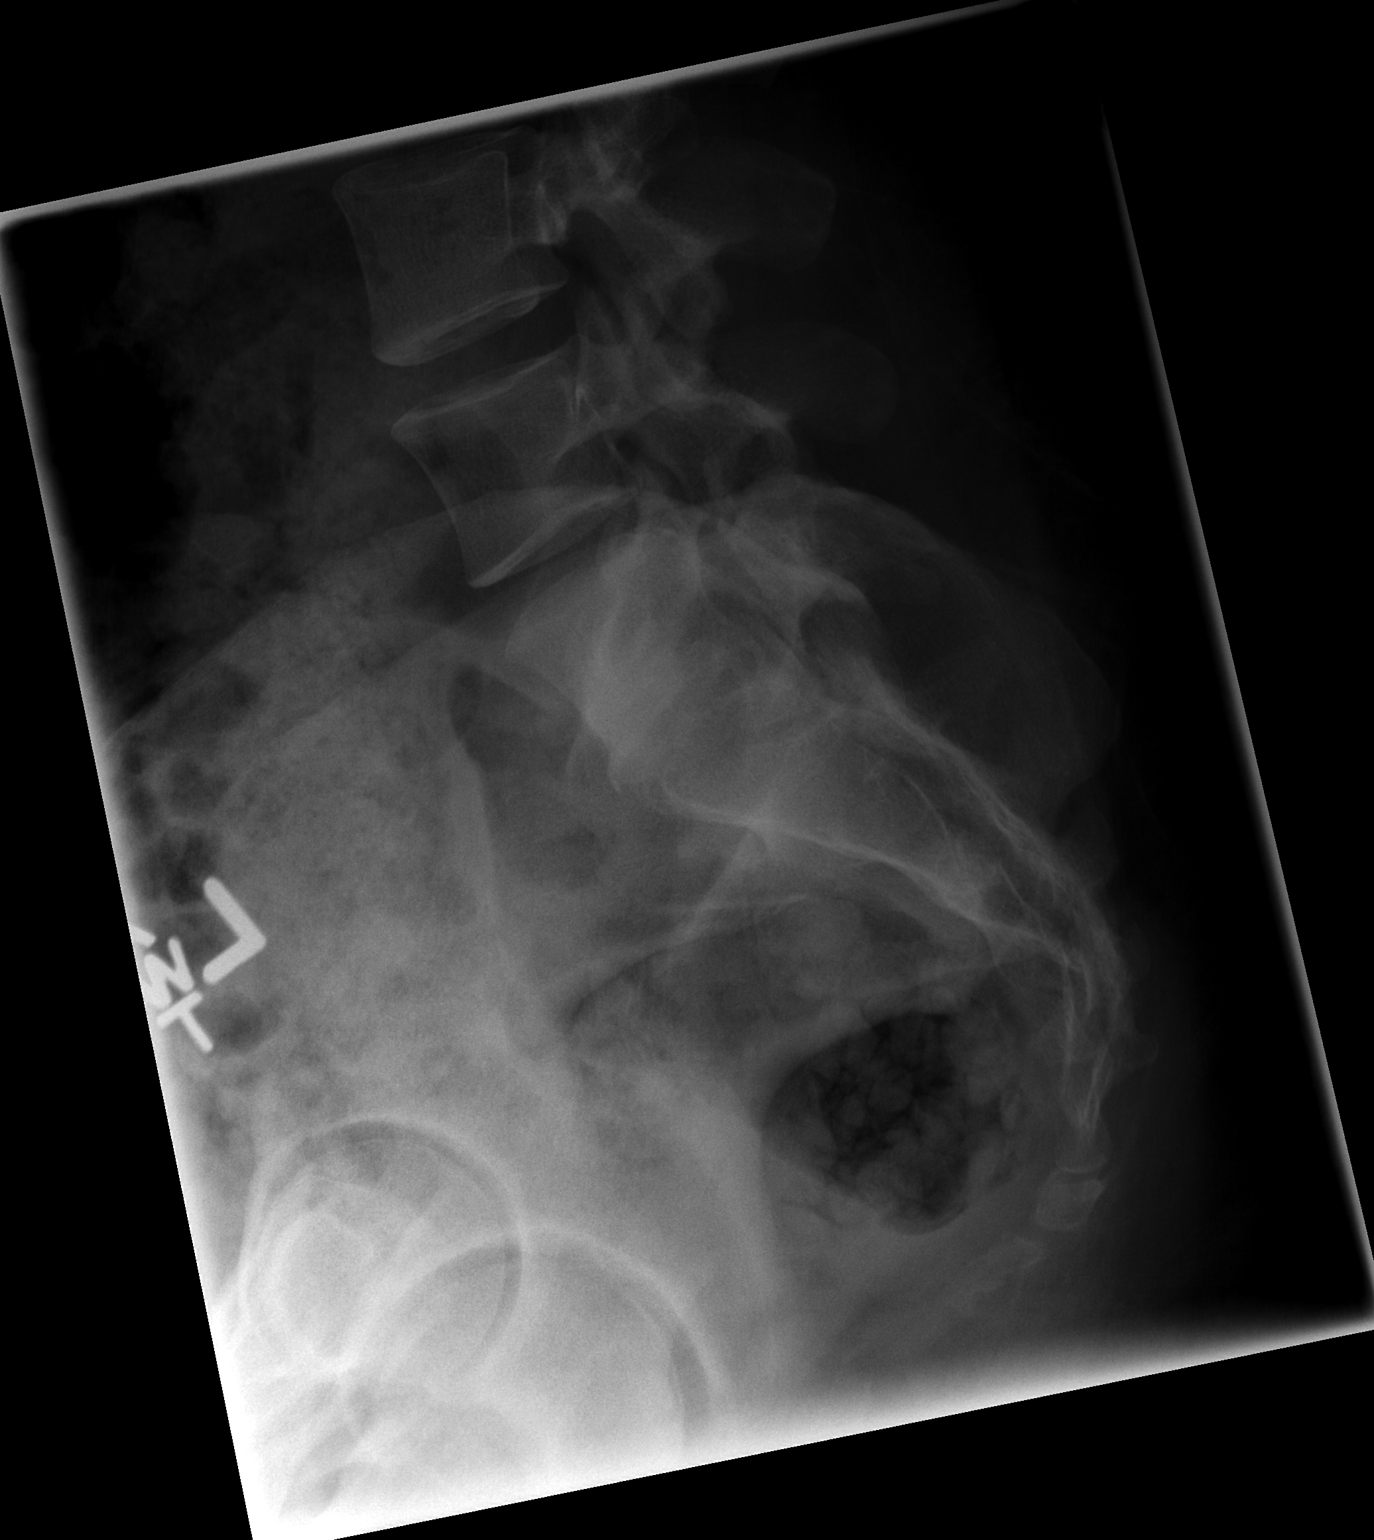

[3 of 3 positions shown; findings below may reference images not displayed]

FINDINGS: There is no evidence of fracture or subluxation. Vertebral bodies
demonstrate normal height and alignment. Intervertebral disc spaces
are preserved. The visualized neural foramina are grossly
unremarkable in appearance. There is incomplete fusion of the
posterior spinous process of S1.

The visualized bowel gas pattern is unremarkable in appearance; air
and stool are noted within the colon. The sacroiliac joints are
within normal limits.
IMPRESSION: No evidence of fracture or subluxation along the lumbar spine.

## 2015-06-19 IMAGING — CT CT HEAD W/O CM
2 series · 16 of 30 positions shown, 20 images · non-contrast
Comparison: None available for comparison at time of study
interpretation.

CLINICAL DATA: Tremor.

EXAM:
CT HEAD WITHOUT CONTRAST
TECHNIQUE: Contiguous axial images were obtained from the base of the skull
through the vertex without intravenous contrast.

[Series 2: head w/o · axial · non-contrast · 0.43mm/px · z∈[+1656,+1781]mm · 13 of 31 slices shown, 17 images]
[im 3/31  brain]
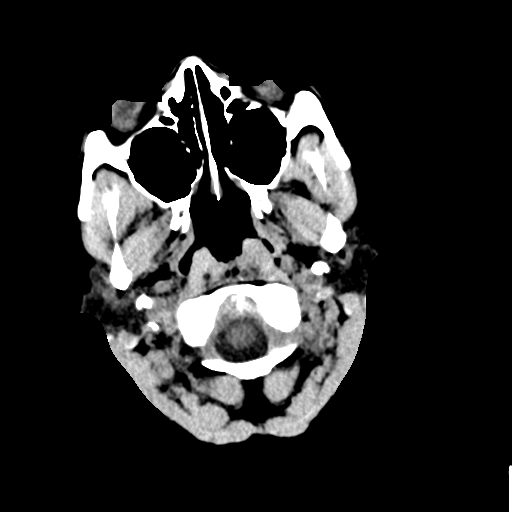
[im 3/31  bone]
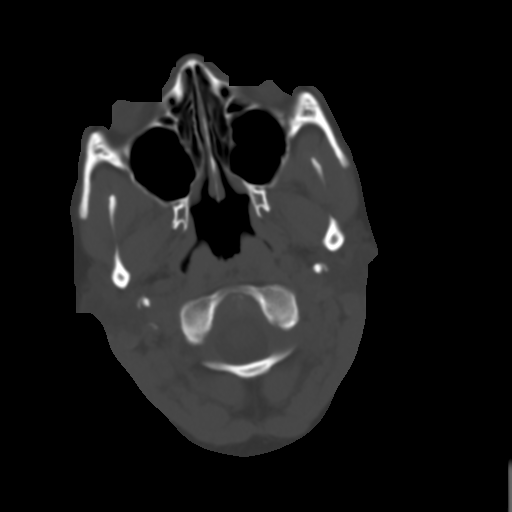
[im 5/31  brain]
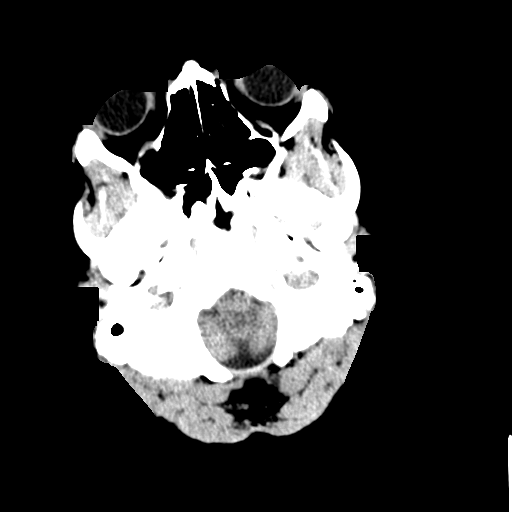
[im 7/31  brain]
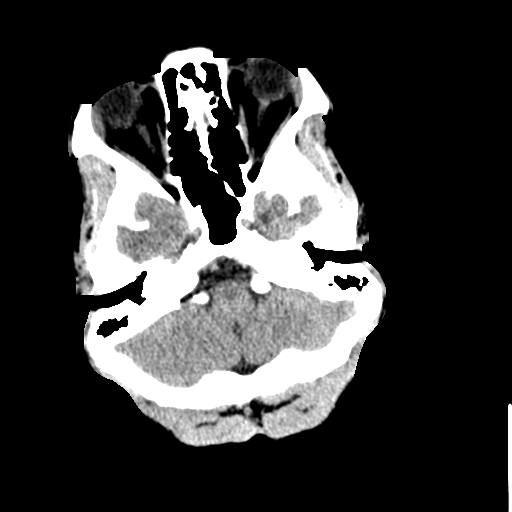
[im 9/31  brain]
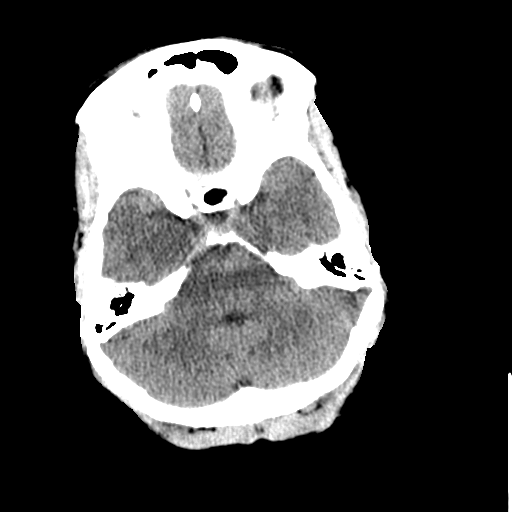
[im 11/31  brain]
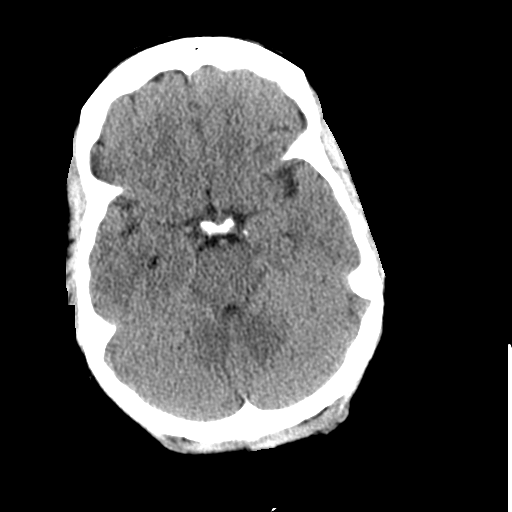
[im 11/31  bone]
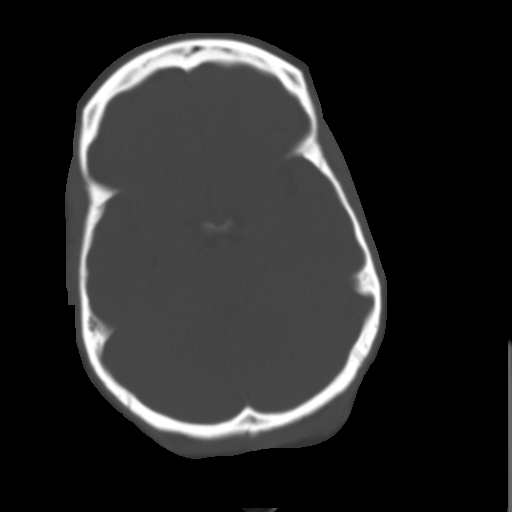
[im 13/31  brain]
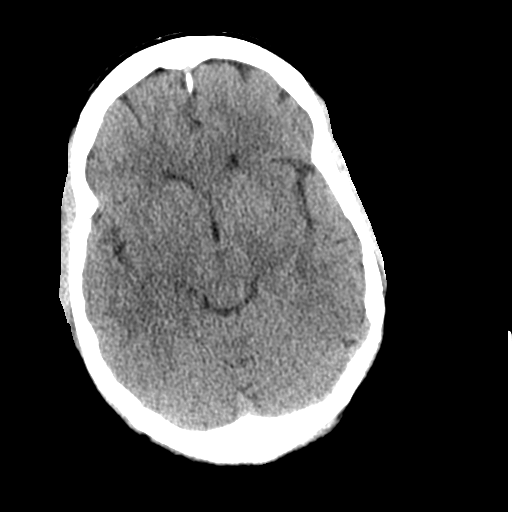
[im 16/31  brain]
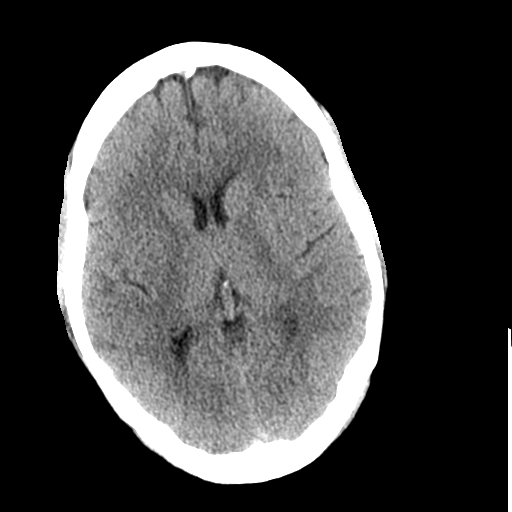
[im 18/31  brain]
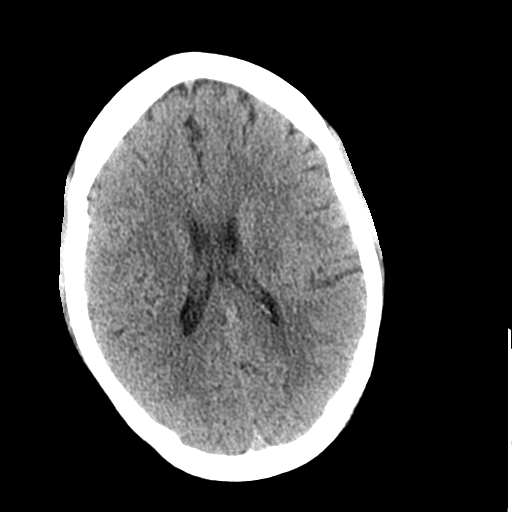
[im 20/31  brain]
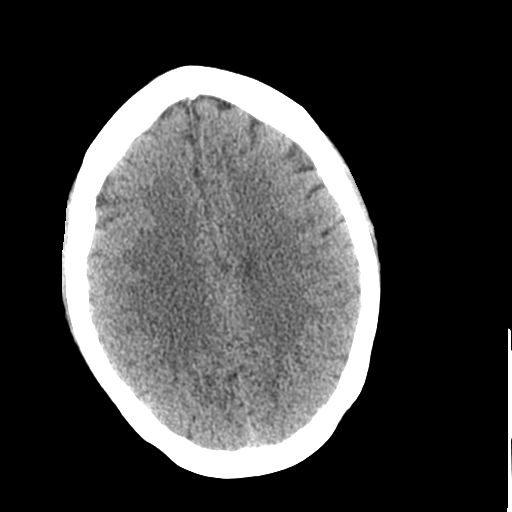
[im 20/31  bone]
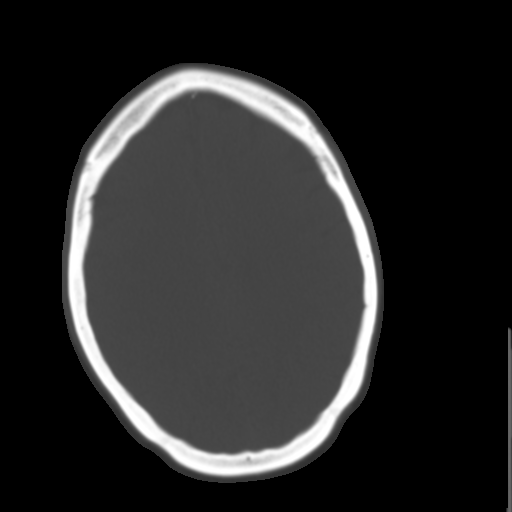
[im 22/31  brain]
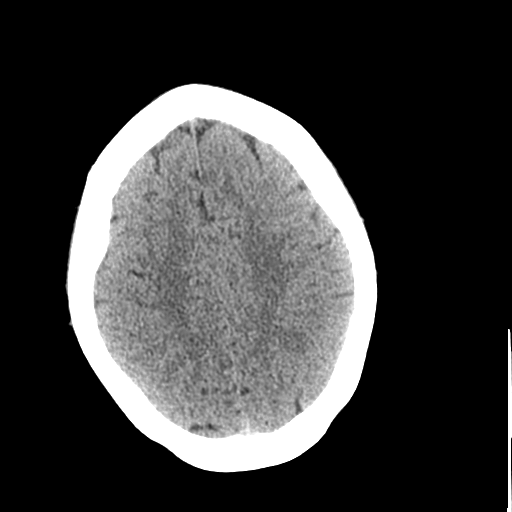
[im 24/31  brain]
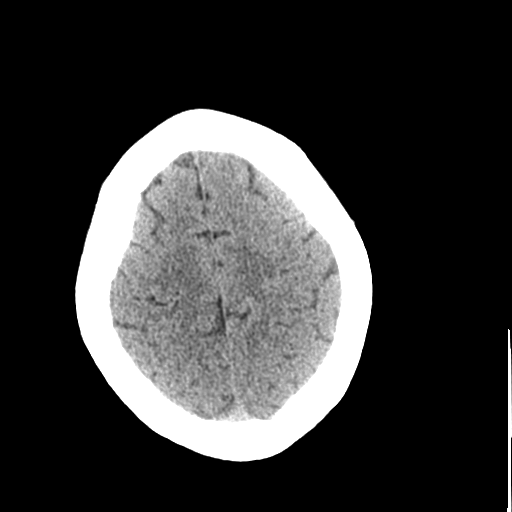
[im 26/31  brain]
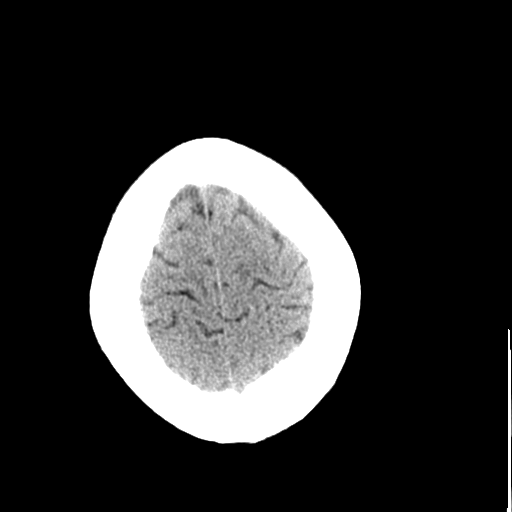
[im 28/31  brain]
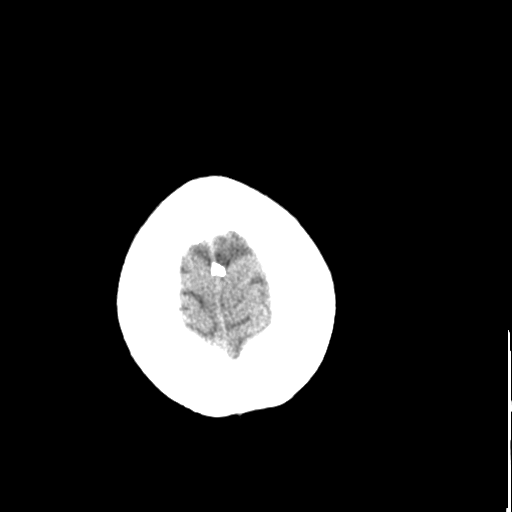
[im 28/31  bone]
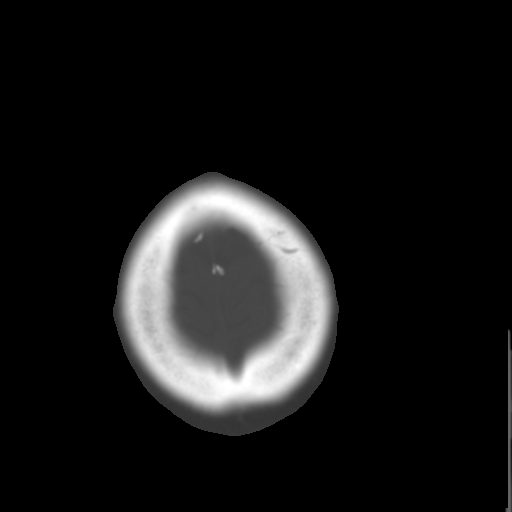

[Series 3: bone windows · axial · 0.43mm/px · z∈[+1656,+1696]mm · 3 of 31 slices shown]
[im 3/31  bone]
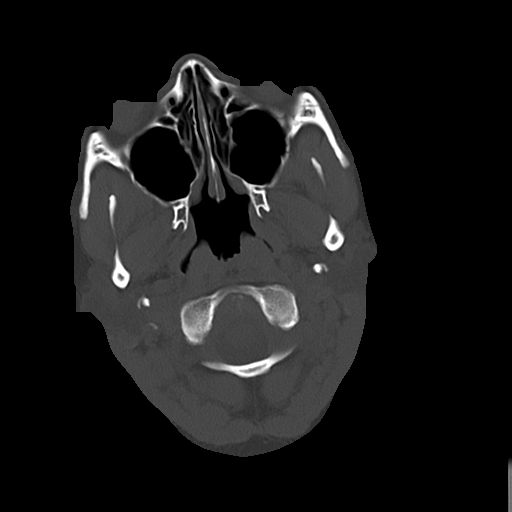
[im 7/31  bone]
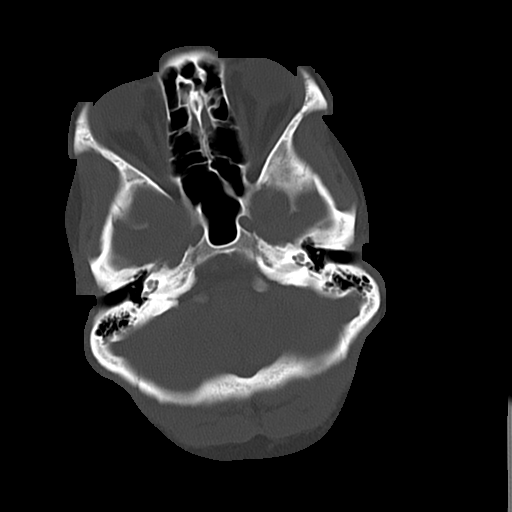
[im 11/31  bone]
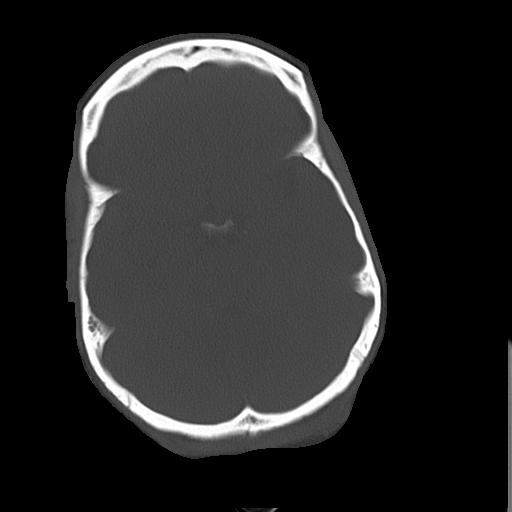

[16 of 30 positions shown; findings below may reference images not displayed]

FINDINGS: The ventricles and sulci are normal. No intraparenchymal hemorrhage,
mass effect nor midline shift. No acute large vascular territory
infarcts.

No abnormal extra-axial fluid collections. Basal cisterns are
patent.

No skull fracture. The included ocular globes and orbital contents
are non-suspicious. The mastoid aircells and included paranasal
sinuses are well-aerated.
IMPRESSION: No acute intracranial process ; normal noncontrast CT of the head.

  By: Fallon Jim

## 2017-09-13 ENCOUNTER — Emergency Department (HOSPITAL_COMMUNITY): Payer: Self-pay

## 2017-09-13 ENCOUNTER — Emergency Department (HOSPITAL_COMMUNITY)
Admission: EM | Admit: 2017-09-13 | Discharge: 2017-09-13 | Disposition: A | Discharge status: Home / Self Care | Payer: Self-pay | Attending: Emergency Medicine | Admitting: Emergency Medicine

## 2017-09-13 ENCOUNTER — Other Ambulatory Visit: Payer: Self-pay

## 2017-09-13 ENCOUNTER — Encounter (HOSPITAL_COMMUNITY): Payer: Self-pay

## 2017-09-13 DIAGNOSIS — Z7982 Long term (current) use of aspirin: Secondary | ICD-10-CM | POA: Insufficient documentation

## 2017-09-13 DIAGNOSIS — Z87891 Personal history of nicotine dependence: Secondary | ICD-10-CM | POA: Insufficient documentation

## 2017-09-13 DIAGNOSIS — Z79899 Other long term (current) drug therapy: Secondary | ICD-10-CM | POA: Insufficient documentation

## 2017-09-13 DIAGNOSIS — Y92009 Unspecified place in unspecified non-institutional (private) residence as the place of occurrence of the external cause: Secondary | ICD-10-CM | POA: Insufficient documentation

## 2017-09-13 DIAGNOSIS — S8001XA Contusion of right knee, initial encounter: Secondary | ICD-10-CM | POA: Insufficient documentation

## 2017-09-13 DIAGNOSIS — E039 Hypothyroidism, unspecified: Secondary | ICD-10-CM | POA: Insufficient documentation

## 2017-09-13 DIAGNOSIS — Y999 Unspecified external cause status: Secondary | ICD-10-CM | POA: Insufficient documentation

## 2017-09-13 DIAGNOSIS — Y939 Activity, unspecified: Secondary | ICD-10-CM | POA: Insufficient documentation

## 2017-09-13 LAB — POC URINE PREG, ED: PREG TEST UR: NEGATIVE

## 2017-09-13 MED ORDER — IBUPROFEN 200 MG PO TABS: 600.0000 mg | ORAL_TABLET | Freq: Three times a day (TID) | ORAL | 0 refills | Status: DC | PRN

## 2017-09-13 MED ORDER — IBUPROFEN 200 MG PO TABS
600.0000 mg | ORAL_TABLET | Freq: Once | ORAL | Status: AC
  Administered 2017-09-13: 600 mg via ORAL
  Filled 2017-09-13: qty 3

## 2017-09-13 MED ORDER — CLONAZEPAM 1 MG PO TBDP
1.0000 mg | ORAL_TABLET | Freq: Once | ORAL | Status: AC
  Administered 2017-09-13: 1 mg via ORAL
  Filled 2017-09-13: qty 1
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
## 2017-09-13 NOTE — Discharge Instructions (Addendum)
Take the ibuprofen as needed for pain, apply ice to help with the swelling and discomfort, follow-up with primary care doctor or orthopedic doctor if not improving in the next week
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
## 2017-09-13 NOTE — ED Notes (Signed)
Patient reported that she might be pregnant and wanted a pregnancy test. EDP notified.

## 2018-11-19 ENCOUNTER — Encounter (HOSPITAL_COMMUNITY): Payer: Self-pay

## 2018-11-19 ENCOUNTER — Other Ambulatory Visit: Payer: Self-pay

## 2018-11-19 DIAGNOSIS — R112 Nausea with vomiting, unspecified: Secondary | ICD-10-CM | POA: Insufficient documentation

## 2018-11-19 DIAGNOSIS — Z87891 Personal history of nicotine dependence: Secondary | ICD-10-CM | POA: Insufficient documentation

## 2018-11-19 LAB — CBC
HCT: 38.4 % (ref 36.0–46.0)
Hemoglobin: 12.1 g/dL (ref 12.0–15.0)
MCH: 26 pg (ref 26.0–34.0)
MCHC: 31.5 g/dL (ref 30.0–36.0)
MCV: 82.4 fL (ref 80.0–100.0)
Platelets: 439 10*3/uL — ABNORMAL HIGH (ref 150–400)
RBC: 4.66 MIL/uL (ref 3.87–5.11)
RDW: 15.5 % (ref 11.5–15.5)
WBC: 9.4 10*3/uL (ref 4.0–10.5)
nRBC: 0 % (ref 0.0–0.2)

## 2018-11-19 LAB — COMPREHENSIVE METABOLIC PANEL
ALT: 15 U/L (ref 0–44)
AST: 16 U/L (ref 15–41)
Albumin: 4.6 g/dL (ref 3.5–5.0)
Alkaline Phosphatase: 86 U/L (ref 38–126)
Anion gap: 12 (ref 5–15)
BUN: 6 mg/dL (ref 6–20)
CO2: 25 mmol/L (ref 22–32)
Calcium: 9.9 mg/dL (ref 8.9–10.3)
Chloride: 102 mmol/L (ref 98–111)
Creatinine, Ser: 0.64 mg/dL (ref 0.44–1.00)
GFR calc Af Amer: >60 mL/min (ref >60)
GFR calc non Af Amer: >60 mL/min (ref >60)
Glucose, Bld: 161 mg/dL — ABNORMAL HIGH (ref 70–99)
Potassium: 3.5 mmol/L (ref 3.5–5.1)
Sodium: 139 mmol/L (ref 135–145)
Total Bilirubin: 0.6 mg/dL (ref 0.3–1.2)
Total Protein: 8.9 g/dL — ABNORMAL HIGH (ref 6.5–8.1)

## 2018-11-19 LAB — I-STAT BETA HCG BLOOD, ED (MC, WL, AP ONLY): I-stat hCG, quantitative: <5 m[IU]/mL (ref <5)

## 2018-11-19 LAB — LIPASE, BLOOD: Lipase: 35 U/L (ref 11–51)

## 2018-11-19 MED ORDER — SODIUM CHLORIDE 0.9% FLUSH: 3.0000 mL | Freq: Once | INTRAVENOUS | Status: DC

## 2018-11-19 NOTE — ED Triage Notes (Signed)
She c/o n/v several times since ~1000 hours. She is in no distress.

## 2018-11-19 NOTE — ED Notes (Signed)
PT DECLINES VITALS AT THIS TIME

## 2018-11-20 ENCOUNTER — Emergency Department (HOSPITAL_COMMUNITY)
Admission: EM | Admit: 2018-11-20 | Discharge: 2018-11-20 | Disposition: A | Discharge status: Home / Self Care | Payer: Self-pay | Attending: Emergency Medicine | Admitting: Emergency Medicine

## 2018-11-20 DIAGNOSIS — R112 Nausea with vomiting, unspecified: Secondary | ICD-10-CM

## 2018-11-20 LAB — URINALYSIS, ROUTINE W REFLEX MICROSCOPIC
Bacteria, UA: NONE SEEN
Bilirubin Urine: NEGATIVE
Glucose, UA: NEGATIVE mg/dL
Hgb urine dipstick: NEGATIVE
Ketones, ur: 80 mg/dL — AB
Leukocytes,Ua: NEGATIVE
Nitrite: NEGATIVE
Protein, ur: 100 mg/dL — AB
Specific Gravity, Urine: 1.021 (ref 1.005–1.030)
pH: 7 (ref 5.0–8.0)

## 2018-11-20 MED ORDER — METOCLOPRAMIDE HCL 5 MG/ML IJ SOLN
10.0000 mg | Freq: Once | INTRAMUSCULAR | Status: AC
  Administered 2018-11-20: 10 mg via INTRAVENOUS
  Filled 2018-11-20: qty 2

## 2018-11-20 MED ORDER — ONDANSETRON HCL 4 MG/2ML IJ SOLN
4.0000 mg | Freq: Once | INTRAMUSCULAR | Status: AC
  Administered 2018-11-20: 4 mg via INTRAVENOUS
  Filled 2018-11-20: qty 2

## 2018-11-20 MED ORDER — CLONAZEPAM 0.5 MG PO TABS
1.0000 mg | ORAL_TABLET | Freq: Once | ORAL | Status: AC
  Administered 2018-11-20: 1 mg via ORAL
  Filled 2018-11-20: qty 2

## 2018-11-20 MED ORDER — ONDANSETRON 4 MG PO TBDP: 4.0000 mg | ORAL_TABLET | Freq: Three times a day (TID) | ORAL | 0 refills | Status: DC | PRN

## 2018-11-20 MED ORDER — SODIUM CHLORIDE 0.9 % IV BOLUS
1000.0000 mL | Freq: Once | INTRAVENOUS | Status: AC
  Administered 2018-11-20: 1000 mL via INTRAVENOUS

## 2018-11-20 NOTE — ED Notes (Signed)
Upon entering room Pt vomited on Floor. Pt had one episode of medium amount of yellow emesis.

## 2018-11-20 NOTE — ED Provider Notes (Signed)
[facility]-EMERGENCY DEPT Provider Note   CSN: [id number] Arrival date & time: 11/19/18  1754     History   Chief Complaint Chief Complaint  Patient presents with  . Emesis    HPI Regina Robertson is a 38 y.o. female.     The history is provided by the patient and medical records.  Emesis    38 y.o. F with hx of thyroid disease, chronic knee pain, anxiety, presenting to the ED for nausea and vomiting.  She reports this began all of a sudden today around 10 AM.  She reports multiple episodes of non-bloody, non-bilious emesis since that time.  She denies any diarrhea or abdominal pain, just states she feels extremely queasy.  Symptoms worse when she tries to move around or attempts to eat/drink.  She denies sick contacts, fever, chills, or known COVID exposures.  She did try taking some OTC nausea meds today without relief.  Past Medical History:  Diagnosis Date  . Anxiety   . Bilateral chronic knee pain   . Thyroid disease     Patient Active Problem List   Diagnosis Date Noted  . Tremor 05/18/2013  . Anemia 05/18/2013  . Knee pain 05/18/2013  . Tremors of nervous system 05/18/2013    Past Surgical History:  Procedure Laterality Date  . THYROIDECTOMY  2007     OB History   No obstetric history on file.      Home Medications    Prior to Admission medications   Medication Sig Start Date End Date Taking? Authorizing Provider  acetaminophen (TYLENOL) 325 MG tablet Take 325 mg by mouth every 6 (six) hours as needed for mild pain.    [provider]  aspirin 325 MG tablet Take 325 mg by mouth every 6 (six) hours as needed for mild pain.    [provider]  clonazePAM (KLONOPIN) 1 MG tablet Take 1 tablet (1 mg total) by mouth 2 (two) times daily as needed (spasms). 09/23/13   Quentin Angst, MD  ibuprofen (ADVIL,MOTRIN) 200 MG tablet Take 3 tablets (600 mg total) by mouth every 8 (eight) hours as needed for moderate pain.  09/13/17   Linwood Dibbles, MD  meloxicam (MOBIC) 15 MG tablet Take 1 tablet (15 mg total) by mouth daily. Patient not taking: Reported on 01/18/2014 09/16/13   Andrena Mews, DO  oxyCODONE (OXY IR/ROXICODONE) 5 MG immediate release tablet Take 1 tablet (5 mg total) by mouth every 6 (six) hours as needed for moderate pain. Patient not taking: Reported on 09/13/2017 05/20/13   Penny Pia, MD  propranolol ER (INDERAL LA) 60 MG 24 hr capsule Take 1 capsule (60 mg total) by mouth daily. Patient not taking: Reported on 01/18/2014 09/04/13   Erick Colace, MD    Family History Family History  Problem Relation Age of Onset  . Cancer Father   . Diabetes Father   . Hypertension Father   . Diabetes Other     Social History Social History   Tobacco Use  . Smoking status: Former Games developer  . Smokeless tobacco: Never Used  . Tobacco comment: electronic cigs.since 03/2012  Substance Use Topics  . Alcohol use: Yes    Comment: occas.  . Drug use: No     Allergies   Patient has no known allergies.   Review of Systems Review of Systems  Gastrointestinal: Positive for nausea and vomiting.  All other systems reviewed and are negative.    Physical Exam Updated Vital  Signs BP (!) 170/88   Pulse 69   Temp 99.3 F (37.4 C) (Oral)   Resp 16   Wt 108 kg   SpO2 100%   BMI 33.21 kg/m   Physical Exam Vitals signs and nursing note reviewed.  Constitutional:      Appearance: She is well-developed.     Comments: Appears nauseated and tired, no active emesis  HENT:     Head: Normocephalic and atraumatic.     Mouth/Throat:     Comments: Mucous membranes appear dry Eyes:     Conjunctiva/sclera: Conjunctivae normal.     Pupils: Pupils are equal, round, and reactive to light.  Neck:     Musculoskeletal: Normal range of motion.  Cardiovascular:     Rate and Rhythm: Normal rate and regular rhythm.     Heart sounds: Normal heart sounds.  Pulmonary:     Effort: Pulmonary effort is  normal.     Breath sounds: Normal breath sounds.  Abdominal:     General: Bowel sounds are normal.     Palpations: Abdomen is soft.     Tenderness: There is no abdominal tenderness. There is no guarding or rebound.     Comments: Soft, non-tender, no distention, normal bowel sounds  Musculoskeletal: Normal range of motion.  Skin:    General: Skin is warm and dry.  Neurological:     Mental Status: She is alert and oriented to person, place, and time.      ED Treatments / Results  Labs (all labs ordered are listed, but only abnormal results are displayed) Labs Reviewed  COMPREHENSIVE METABOLIC PANEL - Abnormal; Notable for the following components:      Result Value   Glucose, Bld 161 (*)    Total Protein 8.9 (*)    All other components within normal limits  CBC - Abnormal; Notable for the following components:   Platelets 439 (*)    All other components within normal limits  LIPASE, BLOOD  URINALYSIS, ROUTINE W REFLEX MICROSCOPIC  I-STAT BETA HCG BLOOD, ED ([facility], [facility], [facility] ONLY)    EKG None  Radiology No results found.  Procedures Procedures (including critical care time)  Medications Ordered in ED Medications  sodium chloride flush (NS) 0.9 % injection 3 mL (has no administration in time range)  sodium chloride 0.9 % bolus 1,000 mL (0 mLs Intravenous Stopped 11/20/18 0242)  ondansetron (ZOFRAN) injection 4 mg (4 mg Intravenous Given 11/20/18 0109)  clonazePAM (KLONOPIN) tablet 1 mg (1 mg Oral Given 11/20/18 0241)  metoCLOPramide (REGLAN) injection 10 mg (10 mg Intravenous Given 11/20/18 0341)     Initial Impression / Assessment and Plan / ED Course  I have reviewed the triage vital signs and the nursing notes.  Pertinent labs & imaging results that were available during my care of the patient were reviewed by me and considered in my medical decision making (see chart for details).  38 year old female here with nausea and vomiting that began today around 10 AM.  She  is afebrile and nontoxic.  She does appear nauseated and fatigued on my exam.  Abdomen soft, non-tender.  Labs overall reassuring-- normal WBC count, normal renal function, no significant electrolyte disturbance.  Plan for IV fluids and antiemetics.  Will reassess.  Still some nausea after zofran.  Able to tolerate a few sips of ginger ale but states feels like she may vomit again.  Given IV Reglan.  Feeling better after Reglan, mildly drowsy.  Able to drink more.  No  active emesis while here in the ED.  Will allow to metabolize and anticipate discharge with continued symptomatic care.  Will need to push oral fluids at home, follow-up closely with PCP.  Return here for any new/acute changes.  Final Clinical Impressions(s) / ED Diagnoses   Final diagnoses:  Non-intractable vomiting with nausea, unspecified vomiting type    ED Discharge Orders         Ordered    ondansetron (ZOFRAN ODT) 4 MG disintegrating tablet  Every 8 hours PRN     11/20/18 0555           Garlon HatchetSanders, Raynell Upton M, PA-C 11/20/18 0628    Ward, Layla MawKristen N, DO 11/20/18 [id number]

## 2018-11-20 NOTE — Discharge Instructions (Signed)
Take the prescribed medication as directed.  Try to push oral fluids as much as you can.  Gentle/bland diet for now and progress back to normal as tolerated. Follow-up with your primary care doctor. Return to the ED for new or worsening symptoms.

## 2020-10-12 ENCOUNTER — Emergency Department (HOSPITAL_COMMUNITY)
Admission: EM | Admit: 2020-10-12 | Discharge: 2020-10-13 | Disposition: A | Discharge status: Home / Self Care | Payer: Medicaid Other | Attending: Emergency Medicine | Admitting: Emergency Medicine

## 2020-10-12 ENCOUNTER — Emergency Department (HOSPITAL_COMMUNITY): Payer: Medicaid Other

## 2020-10-12 DIAGNOSIS — J029 Acute pharyngitis, unspecified: Secondary | ICD-10-CM | POA: Diagnosis present

## 2020-10-12 DIAGNOSIS — Z87891 Personal history of nicotine dependence: Secondary | ICD-10-CM | POA: Insufficient documentation

## 2020-10-12 DIAGNOSIS — J189 Pneumonia, unspecified organism: Secondary | ICD-10-CM | POA: Insufficient documentation

## 2020-10-12 DIAGNOSIS — J020 Streptococcal pharyngitis: Secondary | ICD-10-CM

## 2020-10-12 DIAGNOSIS — N9489 Other specified conditions associated with female genital organs and menstrual cycle: Secondary | ICD-10-CM | POA: Diagnosis not present

## 2020-10-12 DIAGNOSIS — Z20822 Contact with and (suspected) exposure to covid-19: Secondary | ICD-10-CM | POA: Diagnosis not present

## 2020-10-12 LAB — CBC WITH DIFFERENTIAL/PLATELET
Abs Immature Granulocytes: 0.3 10*3/uL — ABNORMAL HIGH (ref 0.00–0.07)
Basophils Absolute: 0.1 10*3/uL (ref 0.0–0.1)
Basophils Relative: 0 %
Eosinophils Absolute: 0 10*3/uL (ref 0.0–0.5)
Eosinophils Relative: 0 %
HCT: 34.7 % — ABNORMAL LOW (ref 36.0–46.0)
Hemoglobin: 10.7 g/dL — ABNORMAL LOW (ref 12.0–15.0)
Immature Granulocytes: 2 %
Lymphocytes Relative: 8 %
Lymphs Abs: 1.2 10*3/uL (ref 0.7–4.0)
MCH: 22.9 pg — ABNORMAL LOW (ref 26.0–34.0)
MCHC: 30.8 g/dL (ref 30.0–36.0)
MCV: 74.3 fL — ABNORMAL LOW (ref 80.0–100.0)
Monocytes Absolute: 1.2 10*3/uL — ABNORMAL HIGH (ref 0.1–1.0)
Monocytes Relative: 7 %
Neutro Abs: 13.5 10*3/uL — ABNORMAL HIGH (ref 1.7–7.7)
Neutrophils Relative %: 83 %
Platelets: 388 10*3/uL (ref 150–400)
RBC: 4.67 MIL/uL (ref 3.87–5.11)
RDW: 18 % — ABNORMAL HIGH (ref 11.5–15.5)
WBC: 16.2 10*3/uL — ABNORMAL HIGH (ref 4.0–10.5)
nRBC: 0 % (ref 0.0–0.2)

## 2020-10-12 LAB — BASIC METABOLIC PANEL
Anion gap: 12 (ref 5–15)
BUN: 7 mg/dL (ref 6–20)
CO2: 24 mmol/L (ref 22–32)
Calcium: 8.6 mg/dL — ABNORMAL LOW (ref 8.9–10.3)
Chloride: 97 mmol/L — ABNORMAL LOW (ref 98–111)
Creatinine, Ser: 0.82 mg/dL (ref 0.44–1.00)
GFR, Estimated: >60 mL/min (ref >60)
Glucose, Bld: 182 mg/dL — ABNORMAL HIGH (ref 70–99)
Potassium: 3.3 mmol/L — ABNORMAL LOW (ref 3.5–5.1)
Sodium: 133 mmol/L — ABNORMAL LOW (ref 135–145)

## 2020-10-12 LAB — I-STAT BETA HCG BLOOD, ED (MC, WL, AP ONLY): I-stat hCG, quantitative: <5 m[IU]/mL (ref <5)

## 2020-10-12 LAB — GROUP A STREP BY PCR: Group A Strep by PCR: DETECTED — AB

## 2020-10-12 MED ORDER — IOHEXOL 350 MG/ML SOLN
75.0000 mL | Freq: Once | INTRAVENOUS | Status: AC | PRN
  Administered 2020-10-12: 75 mL via INTRAVENOUS

## 2020-10-12 MED ORDER — IBUPROFEN 800 MG PO TABS
800.0000 mg | ORAL_TABLET | Freq: Once | ORAL | Status: AC
  Administered 2020-10-12: 800 mg via ORAL
  Filled 2020-10-12: qty 2

## 2020-10-12 NOTE — ED Triage Notes (Signed)
Pt arrives via EMS from home with sore throat for about the last week. Has tried home remedies with little improvement in symptoms. VSS. PT alert, oriented x4, ambulatory. NAD at present.

## 2020-10-12 NOTE — ED Notes (Signed)
Patient called x4 for room with no response and not visible in the lobby

## 2020-10-12 NOTE — ED Provider Notes (Signed)
Emergency Medicine Provider Triage Evaluation Note  Regina Robertson , a 40 y.o. female  was evaluated in triage.  Pt complains of sore throat.  Review of Systems  Positive: Sore throat, facial pain, fever, chills, trouble swallowing, diarrhea, headache Negative: cough  Physical Exam  Ht 5\' 11"  (1.803 m)   Wt 117.9 kg   BMI 36.26 kg/m  Gen:   Awake, appears uncomfortable Resp:  Normal effort  MSK:   Moves extremities without difficulty  Other:  Uvula midline, bilateral tonsillar enlargement without exudates, trismus noted.  Ttp to L submental region with edema.    Medical Decision Making  Medically screening exam initiated at 11:26 AM.  Appropriate orders placed.  Areta Mullane was informed that the remainder of the evaluation will be completed by another provider, this initial triage assessment does not replace that evaluation, and the importance of remaining in the ED until their evaluation is complete.  Sore throat and facial pain x 1 week. Took tylenol and ASA without relief.  Difficulty talking and swallowing.  L sided neck pain.    Leonel Ramsay, PA-C 10/12/20 1134    Poll[place], 10/14/20, MD 10/13/20 [phone]

## 2020-10-13 LAB — RESP PANEL BY RT-PCR (FLU A&B, COVID) ARPGX2
Influenza A by PCR: NEGATIVE
Influenza B by PCR: NEGATIVE
SARS Coronavirus 2 by RT PCR: NEGATIVE

## 2020-10-13 MED ORDER — DEXAMETHASONE SODIUM PHOSPHATE 10 MG/ML IJ SOLN
10.0000 mg | Freq: Once | INTRAMUSCULAR | Status: AC
  Administered 2020-10-13: 10 mg via INTRAVENOUS
  Filled 2020-10-13: qty 1

## 2020-10-13 MED ORDER — AZITHROMYCIN 250 MG PO TABS: 250.0000 mg | ORAL_TABLET | Freq: Every day | ORAL | 0 refills | Status: DC

## 2020-10-13 MED ORDER — SODIUM CHLORIDE 0.9 % IV SOLN
1.0000 g | Freq: Once | INTRAVENOUS | Status: AC
  Administered 2020-10-13: 1 g via INTRAVENOUS
  Filled 2020-10-13: qty 10

## 2020-10-13 MED ORDER — SODIUM CHLORIDE 0.9 % IV BOLUS
1000.0000 mL | Freq: Once | INTRAVENOUS | Status: AC
  Administered 2020-10-13: 1000 mL via INTRAVENOUS

## 2020-10-13 MED ORDER — AZITHROMYCIN 250 MG PO TABS
500.0000 mg | ORAL_TABLET | Freq: Once | ORAL | Status: AC
  Administered 2020-10-13: 500 mg via ORAL
  Filled 2020-10-13: qty 2

## 2020-10-13 MED ORDER — CLONAZEPAM 0.5 MG PO TABS
1.0000 mg | ORAL_TABLET | ORAL | Status: AC
  Administered 2020-10-13: 1 mg via ORAL
  Filled 2020-10-13: qty 2

## 2020-10-13 MED ORDER — CEFDINIR 300 MG PO CAPS: 300.0000 mg | ORAL_CAPSULE | Freq: Two times a day (BID) | ORAL | 0 refills | Status: DC

## 2020-10-13 NOTE — ED Notes (Signed)
Pt c/o a sore throat and pneumonia for several days

## 2020-10-13 NOTE — ED Provider Notes (Signed)
[facility] EMERGENCY DEPARTMENT Provider Note   CSN: [id number] Arrival date & time: 10/12/20  1119     History Chief Complaint  Patient presents with   Sore Throat    Regina Robertson is a 40 y.o. female.  Patient presents for evaluation of sore throat. She has had generalized weakness, fever, chills, runny nose and cough as well. Symptoms present for one week, progressively worsening.      Past Medical History:  Diagnosis Date   Anxiety    Bilateral chronic knee pain    Thyroid disease     Patient Active Problem List   Diagnosis Date Noted   Tremor 05/18/2013   Anemia 05/18/2013   Knee pain 05/18/2013   Tremors of nervous system 05/18/2013    Past Surgical History:  Procedure Laterality Date   THYROIDECTOMY  2007     OB History   No obstetric history on file.     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   Drug use: No    Home Medications Prior to Admission medications   Medication Sig Start Date End Date Taking? Authorizing Provider  azithromycin (ZITHROMAX Z-PAK) 250 MG tablet Take 1 tablet (250 mg total) by mouth daily. 10/14/20  Yes Dorthy Magnussen, Canary Brim, MD  cefdinir (OMNICEF) 300 MG capsule Take 1 capsule (300 mg total) by mouth 2 (two) times daily. 10/14/20  Yes Jaquaveon Bilal, Canary Brim, MD  clonazePAM (KLONOPIN) 1 MG tablet Take 1 tablet (1 mg total) by mouth 2 (two) times daily as needed (spasms). 09/23/13   Quentin Angst, MD  ibuprofen (ADVIL,MOTRIN) 200 MG tablet Take 3 tablets (600 mg total) by mouth every 8 (eight) hours as needed for moderate pain. Patient not taking: Reported on 11/20/2018  09/13/17   Linwood Dibbles, MD  meloxicam (MOBIC) 15 MG tablet Take 1 tablet (15 mg total) by mouth daily. Patient not taking: Reported on 01/18/2014 09/16/13   Andrena Mews, DO  ondansetron (ZOFRAN ODT) 4 MG disintegrating tablet Take 1 tablet (4 mg total) by mouth every 8 (eight) hours as needed for nausea. 11/20/18   Garlon Hatchet, PA-C  oxyCODONE (OXY IR/ROXICODONE) 5 MG immediate release tablet Take 1 tablet (5 mg total) by mouth every 6 (six) hours as needed for moderate pain. Patient not taking: Reported on 09/13/2017 05/20/13   Penny Pia, MD  propranolol ER (INDERAL LA) 60 MG 24 hr capsule Take 1 capsule (60 mg total) by mouth daily. Patient not taking: Reported on 01/18/2014 09/04/13   Erick Colace, MD    Allergies    Patient has no known allergies.  Review of Systems   Review of Systems  Constitutional:  Positive for chills and fever.  HENT:  Positive for rhinorrhea and sore throat.   Respiratory:  Positive for cough.   All other systems reviewed and are negative.  Physical Exam Updated Vital Signs BP (!) 155/88   Pulse 96   Temp 99.7 F (37.6 C)   Resp 14   Ht 5\' 11"  (1.803 m)   Wt 117.9 kg  SpO2 100%   BMI 36.26 kg/m   Physical Exam Vitals and nursing note reviewed.  Constitutional:      General: She is not in acute distress.    Appearance: Normal appearance. She is well-developed.  HENT:     Head: Normocephalic and atraumatic.     Right Ear: Hearing normal.     Left Ear: Hearing normal.     Nose: Nose normal.     Mouth/Throat:     Pharynx: Posterior oropharyngeal erythema and uvula swelling present. No oropharyngeal exudate.     Tonsils: No tonsillar exudate or tonsillar abscesses. 1+ on the right. 1+ on the left.  Eyes:     Conjunctiva/sclera: Conjunctivae normal.     Pupils: Pupils are equal, round, and reactive to light.  Cardiovascular:     Rate and Rhythm: Regular rhythm.     Heart sounds: S1 normal and S2 normal. No murmur heard.   No  friction rub. No gallop.  Pulmonary:     Effort: Pulmonary effort is normal. No respiratory distress.     Breath sounds: Normal breath sounds.  Chest:     Chest wall: No tenderness.  Abdominal:     General: Bowel sounds are normal.     Palpations: Abdomen is soft.     Tenderness: There is no abdominal tenderness. There is no guarding or rebound. Negative signs include Murphy's sign and McBurney's sign.     Hernia: No hernia is present.  Musculoskeletal:        General: Normal range of motion.     Cervical back: Normal range of motion and neck supple.  Skin:    General: Skin is warm and dry.     Findings: No rash.  Neurological:     Mental Status: She is alert and oriented to person, place, and time.     GCS: GCS eye subscore is 4. GCS verbal subscore is 5. GCS motor subscore is 6.     Cranial Nerves: No cranial nerve deficit.     Sensory: No sensory deficit.     Coordination: Coordination normal.  Psychiatric:        Speech: Speech normal.        Behavior: Behavior normal.        Thought Content: Thought content normal.    ED Results / Procedures / Treatments   Labs (all labs ordered are listed, but only abnormal results are displayed) Labs Reviewed  GROUP A STREP BY PCR - Abnormal; Notable for the following components:      Result Value   Group A Strep by PCR DETECTED (*)    All other components within normal limits  BASIC METABOLIC PANEL - Abnormal; Notable for the following components:   Sodium 133 (*)    Potassium 3.3 (*)    Chloride 97 (*)    Glucose, Bld 182 (*)    Calcium 8.6 (*)    All other components within normal limits  CBC WITH DIFFERENTIAL/PLATELET - Abnormal; Notable for the following components:   WBC 16.2 (*)    Hemoglobin 10.7 (*)    HCT 34.7 (*)    MCV 74.3 (*)    MCH 22.9 (*)    RDW 18.0 (*)    Neutro Abs 13.5 (*)    Monocytes Absolute 1.2 (*)    Abs Immature Granulocytes 0.30 (*)    All other components within normal limits  RESP PANEL BY  RT-PCR (FLU A&B, COVID) ARPGX2  RESP PANEL BY RT-PCR (FLU A&B,  COVID) ARPGX2  I-STAT BETA HCG BLOOD, ED ([facility], [facility], [facility] ONLY)  POC URINE PREG, ED    EKG None  Radiology DG Chest 2 View  Result Date: 10/12/2020 CLINICAL DATA:  Sore throat and productive cough for 2 days, initial encounter EXAM: CHEST - 2 VIEW COMPARISON:  None. FINDINGS: Cardiac shadow is mildly enlarged. Left lung is clear. There is increased density in the right lung base which projects in the right middle lobe on the lateral film consistent with acute pneumonia. No bony abnormality is noted. IMPRESSION: Right middle lobe pneumonia. Followup PA and lateral chest X-ray is recommended in 3-4 weeks following trial of antibiotic therapy to ensure resolution and exclude underlying malignancy. Electronically Signed   By: Alcide Clever M.D.   On: 10/12/2020 16:38   CT Soft Tissue Neck W Contrast  Result Date: 10/12/2020 CLINICAL DATA:  Neck pain.  Sore throat.  Difficulty swallowing. EXAM: CT NECK WITH CONTRAST TECHNIQUE: Multidetector CT imaging of the neck was performed using the standard protocol following the bolus administration of intravenous contrast. CONTRAST:  24mL OMNIPAQUE IOHEXOL 350 MG/ML SOLN COMPARISON:  None. FINDINGS: Pharynx and larynx: Asymmetric soft tissue is present left nasopharynx. Mild prominence adenoid tissue noted. Discrete lesion is present. By palatine tonsil is enlarged and inflamed. It is somewhat heterogeneous. There is stranding the parapharyngeal fat. Discrete fluid collection present. The airway is patent. Soft palate is mildly inflamed. Tongue base is within normal limits. Vallecula unremarkable. Aryepiglottic folds and piriform sinuses are clear. Vocal cords are midline and symmetric. Trachea is clear. Salivary glands: Significant inflammatory change surrounds the left submandibular gland., likely secondary to inflammation. No intrinsic lesions are present. Submandibular ducts are within normal limits  bilaterally. Parotid glands ducts are within normal limits. Intraparotid or periparotid lymph nodes are noted posteriorly and inferiorly bilaterally, benign. Thyroid: Postsurgical changes noted in the left lobe of the gland. No discrete lesions. Lymph nodes: Reactive type level-II lymph nodes noted bilaterally. No rounded or necrotic nodes present. No enlarged lymph nodes. Vascular: Negative. Limited intracranial: Within normal limits. Visualized orbits: Visualized orbits are within normal limits. Mastoids and visualized paranasal sinuses: The paranasal sinuses and mastoid air cells are clear. Skeleton: No acute or aggressive process. Upper chest: Patchy airspace opacities are present right upper lobe. Thoracic inlet is within normal limits. IMPRESSION: 1. Enlarged and inflamed left palatine tonsil compatible with acute pharyngitis. 2. No discrete abscess. 3. Reactive type level-I lymph nodes bilaterally. 4. Postsurgical changes of the left lobe of the thyroid gland. 5. Patchy airspace disease in the right upper lobe is concerning for pneumonia. Electronically Signed   By: Marin Roberts M.D.   On: 10/12/2020 14:43    Procedures Procedures   Medications Ordered in ED Medications  azithromycin (ZITHROMAX) tablet 500 mg (has no administration in time range)  ibuprofen (ADVIL) tablet 800 mg (800 mg Oral Given 10/12/20 1129)  iohexol (OMNIPAQUE) 350 MG/ML injection 75 mL (75 mLs Intravenous Contrast Given 10/12/20 1427)  sodium chloride 0.9 % bolus 1,000 mL (1,000 mLs Intravenous New Bag/Given 10/13/20 0221)  dexamethasone (DECADRON) injection 10 mg (10 mg Intravenous Given 10/13/20 0224)  cefTRIAXone (ROCEPHIN) 1 g in sodium chloride 0.9 % 100 mL IVPB (0 g Intravenous Stopped 10/13/20 0343)  clonazePAM (KLONOPIN) tablet 1 mg (1 mg Oral Given 10/13/20 0225)    ED Course  I have reviewed the triage vital signs and the nursing notes.  Pertinent labs & imaging results that were available during my care  of the patient  were reviewed by me and considered in my medical decision making (see chart for details).    MDM Rules/Calculators/A&P                           Patient with URI symptoms for one week. Strep +. No PTA on CT. Evidence of Pneumonia present on CXR. No hypoxia. Treat with IVF, Decadron, Rocephin.  Final Clinical Impression(s) / ED Diagnoses Final diagnoses:  Strep throat  Community acquired pneumonia, unspecified laterality    Rx / DC Orders ED Discharge Orders          Ordered    cefdinir (OMNICEF) 300 MG capsule  2 times daily        10/13/20 0709    azithromycin (ZITHROMAX Z-PAK) 250 MG tablet  Daily        10/13/20 0709             Gilda Crease, MD 10/13/20 [phone]

## 2020-11-11 ENCOUNTER — Encounter (HOSPITAL_COMMUNITY): Payer: Self-pay | Admitting: Radiology

## 2021-01-10 ENCOUNTER — Encounter (HOSPITAL_COMMUNITY): Payer: Self-pay

## 2021-01-10 ENCOUNTER — Emergency Department (HOSPITAL_COMMUNITY): Payer: Medicaid Other

## 2021-01-10 ENCOUNTER — Other Ambulatory Visit: Payer: Self-pay

## 2021-01-10 ENCOUNTER — Inpatient Hospital Stay (HOSPITAL_COMMUNITY)
Admission: EM | Admit: 2021-01-10 | Discharge: 2021-01-13 | DRG: 761 | Disposition: A | Discharge status: Home / Self Care | Payer: Medicaid Other | Attending: Internal Medicine | Admitting: Internal Medicine

## 2021-01-10 DIAGNOSIS — R1031 Right lower quadrant pain: Secondary | ICD-10-CM | POA: Diagnosis present

## 2021-01-10 DIAGNOSIS — M25562 Pain in left knee: Secondary | ICD-10-CM | POA: Diagnosis present

## 2021-01-10 DIAGNOSIS — E890 Postprocedural hypothyroidism: Secondary | ICD-10-CM | POA: Diagnosis present

## 2021-01-10 DIAGNOSIS — R112 Nausea with vomiting, unspecified: Secondary | ICD-10-CM | POA: Diagnosis present

## 2021-01-10 DIAGNOSIS — K760 Fatty (change of) liver, not elsewhere classified: Secondary | ICD-10-CM | POA: Diagnosis present

## 2021-01-10 DIAGNOSIS — N83201 Unspecified ovarian cyst, right side: Principal | ICD-10-CM | POA: Diagnosis present

## 2021-01-10 DIAGNOSIS — Z20822 Contact with and (suspected) exposure to covid-19: Secondary | ICD-10-CM | POA: Diagnosis present

## 2021-01-10 DIAGNOSIS — E876 Hypokalemia: Secondary | ICD-10-CM | POA: Diagnosis present

## 2021-01-10 DIAGNOSIS — G8929 Other chronic pain: Secondary | ICD-10-CM | POA: Diagnosis present

## 2021-01-10 DIAGNOSIS — Z79899 Other long term (current) drug therapy: Secondary | ICD-10-CM

## 2021-01-10 DIAGNOSIS — M25561 Pain in right knee: Secondary | ICD-10-CM | POA: Diagnosis present

## 2021-01-10 DIAGNOSIS — R739 Hyperglycemia, unspecified: Secondary | ICD-10-CM | POA: Diagnosis present

## 2021-01-10 DIAGNOSIS — I10 Essential (primary) hypertension: Secondary | ICD-10-CM | POA: Diagnosis present

## 2021-01-10 DIAGNOSIS — Z28310 Unvaccinated for covid-19: Secondary | ICD-10-CM

## 2021-01-10 DIAGNOSIS — K838 Other specified diseases of biliary tract: Secondary | ICD-10-CM | POA: Diagnosis present

## 2021-01-10 DIAGNOSIS — E1165 Type 2 diabetes mellitus with hyperglycemia: Secondary | ICD-10-CM | POA: Diagnosis present

## 2021-01-10 DIAGNOSIS — E669 Obesity, unspecified: Secondary | ICD-10-CM | POA: Diagnosis present

## 2021-01-10 DIAGNOSIS — N83209 Unspecified ovarian cyst, unspecified side: Secondary | ICD-10-CM | POA: Diagnosis present

## 2021-01-10 DIAGNOSIS — E785 Hyperlipidemia, unspecified: Secondary | ICD-10-CM | POA: Diagnosis present

## 2021-01-10 DIAGNOSIS — D509 Iron deficiency anemia, unspecified: Secondary | ICD-10-CM | POA: Diagnosis present

## 2021-01-10 DIAGNOSIS — R197 Diarrhea, unspecified: Secondary | ICD-10-CM | POA: Diagnosis present

## 2021-01-10 DIAGNOSIS — Z6836 Body mass index (BMI) 36.0-36.9, adult: Secondary | ICD-10-CM

## 2021-01-10 DIAGNOSIS — R160 Hepatomegaly, not elsewhere classified: Secondary | ICD-10-CM | POA: Diagnosis present

## 2021-01-10 DIAGNOSIS — Z87891 Personal history of nicotine dependence: Secondary | ICD-10-CM

## 2021-01-10 DIAGNOSIS — R251 Tremor, unspecified: Secondary | ICD-10-CM | POA: Diagnosis present

## 2021-01-10 DIAGNOSIS — E119 Type 2 diabetes mellitus without complications: Secondary | ICD-10-CM

## 2021-01-10 DIAGNOSIS — Z833 Family history of diabetes mellitus: Secondary | ICD-10-CM

## 2021-01-10 DIAGNOSIS — Z8249 Family history of ischemic heart disease and other diseases of the circulatory system: Secondary | ICD-10-CM

## 2021-01-10 LAB — URINALYSIS, ROUTINE W REFLEX MICROSCOPIC
Bacteria, UA: NONE SEEN
Bilirubin Urine: NEGATIVE
Glucose, UA: NEGATIVE mg/dL
Ketones, ur: 80 mg/dL — AB
Leukocytes,Ua: NEGATIVE
Nitrite: NEGATIVE
Protein, ur: 30 mg/dL — AB
Specific Gravity, Urine: >1.046 — ABNORMAL HIGH (ref 1.005–1.030)
pH: 6 (ref 5.0–8.0)

## 2021-01-10 LAB — COMPREHENSIVE METABOLIC PANEL
ALT: 44 U/L (ref 0–44)
AST: 35 U/L (ref 15–41)
Albumin: 4.1 g/dL (ref 3.5–5.0)
Alkaline Phosphatase: 98 U/L (ref 38–126)
Anion gap: 14 (ref 5–15)
BUN: 12 mg/dL (ref 6–20)
CO2: 25 mmol/L (ref 22–32)
Calcium: 9.6 mg/dL (ref 8.9–10.3)
Chloride: 97 mmol/L — ABNORMAL LOW (ref 98–111)
Creatinine, Ser: 0.77 mg/dL (ref 0.44–1.00)
GFR, Estimated: >60 mL/min (ref >60)
Glucose, Bld: 177 mg/dL — ABNORMAL HIGH (ref 70–99)
Potassium: 3.8 mmol/L (ref 3.5–5.1)
Sodium: 136 mmol/L (ref 135–145)
Total Bilirubin: 0.8 mg/dL (ref 0.3–1.2)
Total Protein: 8.8 g/dL — ABNORMAL HIGH (ref 6.5–8.1)

## 2021-01-10 LAB — RESP PANEL BY RT-PCR (FLU A&B, COVID) ARPGX2
Influenza A by PCR: NEGATIVE
Influenza B by PCR: NEGATIVE
SARS Coronavirus 2 by RT PCR: NEGATIVE

## 2021-01-10 LAB — CBC
HCT: 38.7 % (ref 36.0–46.0)
Hemoglobin: 11.9 g/dL — ABNORMAL LOW (ref 12.0–15.0)
MCH: 23.5 pg — ABNORMAL LOW (ref 26.0–34.0)
MCHC: 30.7 g/dL (ref 30.0–36.0)
MCV: 76.3 fL — ABNORMAL LOW (ref 80.0–100.0)
Platelets: 409 10*3/uL — ABNORMAL HIGH (ref 150–400)
RBC: 5.07 MIL/uL (ref 3.87–5.11)
RDW: 18.6 % — ABNORMAL HIGH (ref 11.5–15.5)
WBC: 13.6 10*3/uL — ABNORMAL HIGH (ref 4.0–10.5)
nRBC: 0 % (ref 0.0–0.2)

## 2021-01-10 LAB — LIPASE, BLOOD: Lipase: 37 U/L (ref 11–51)

## 2021-01-10 LAB — I-STAT BETA HCG BLOOD, ED (MC, WL, AP ONLY): I-stat hCG, quantitative: <5 m[IU]/mL (ref <5)

## 2021-01-10 MED ORDER — SODIUM CHLORIDE (PF) 0.9 % IJ SOLN
INTRAMUSCULAR | Status: AC
  Filled 2021-01-10: qty 50

## 2021-01-10 MED ORDER — ONDANSETRON HCL 4 MG/2ML IJ SOLN
4.0000 mg | Freq: Once | INTRAMUSCULAR | Status: AC
  Administered 2021-01-10: 08:00:00 4 mg via INTRAVENOUS
  Filled 2021-01-10: qty 2

## 2021-01-10 MED ORDER — HYDROMORPHONE HCL 1 MG/ML IJ SOLN
1.0000 mg | Freq: Once | INTRAMUSCULAR | Status: AC
  Administered 2021-01-10: 10:00:00 1 mg via INTRAVENOUS
  Filled 2021-01-10: qty 1

## 2021-01-10 MED ORDER — ACETAMINOPHEN 325 MG PO TABS
650.0000 mg | ORAL_TABLET | Freq: Four times a day (QID) | ORAL | Status: DC | PRN
  Administered 2021-01-12: 22:00:00 650 mg via ORAL
  Filled 2021-01-10: qty 2

## 2021-01-10 MED ORDER — METOCLOPRAMIDE HCL 5 MG/ML IJ SOLN
10.0000 mg | Freq: Once | INTRAMUSCULAR | Status: AC
  Administered 2021-01-10: 10:00:00 10 mg via INTRAVENOUS
  Filled 2021-01-10: qty 2

## 2021-01-10 MED ORDER — HYDROMORPHONE HCL 1 MG/ML IJ SOLN
1.0000 mg | INTRAMUSCULAR | Status: DC | PRN
  Administered 2021-01-10 – 2021-01-12 (×6): 1 mg via INTRAVENOUS
  Filled 2021-01-10 (×7): qty 1

## 2021-01-10 MED ORDER — ACETAMINOPHEN 650 MG RE SUPP: 650.0000 mg | Freq: Four times a day (QID) | RECTAL | Status: DC | PRN

## 2021-01-10 MED ORDER — METOPROLOL TARTRATE 5 MG/5ML IV SOLN
5.0000 mg | Freq: Three times a day (TID) | INTRAVENOUS | Status: DC
  Administered 2021-01-10 – 2021-01-13 (×8): 5 mg via INTRAVENOUS
  Filled 2021-01-10 (×8): qty 5

## 2021-01-10 MED ORDER — CLONAZEPAM 1 MG PO TABS
1.0000 mg | ORAL_TABLET | Freq: Two times a day (BID) | ORAL | Status: DC | PRN
  Administered 2021-01-11 – 2021-01-13 (×2): 1 mg via ORAL
  Filled 2021-01-10 (×2): qty 1

## 2021-01-10 MED ORDER — SODIUM CHLORIDE 0.9 % IV BOLUS
1000.0000 mL | Freq: Once | INTRAVENOUS | Status: AC
  Administered 2021-01-10: 08:00:00 1000 mL via INTRAVENOUS

## 2021-01-10 MED ORDER — ONDANSETRON HCL 4 MG PO TABS
4.0000 mg | ORAL_TABLET | Freq: Four times a day (QID) | ORAL | Status: DC | PRN
  Administered 2021-01-13: 14:00:00 4 mg via ORAL
  Filled 2021-01-10: qty 1

## 2021-01-10 MED ORDER — PANTOPRAZOLE SODIUM 40 MG IV SOLR
40.0000 mg | Freq: Every day | INTRAVENOUS | Status: DC
  Administered 2021-01-10 – 2021-01-13 (×4): 40 mg via INTRAVENOUS
  Filled 2021-01-10 (×4): qty 40

## 2021-01-10 MED ORDER — CLONAZEPAM 1 MG PO TABS
1.0000 mg | ORAL_TABLET | Freq: Once | ORAL | Status: AC
Start: 2021-01-10 — End: 2021-01-10
  Administered 2021-01-10: 20:00:00 1 mg via ORAL
  Filled 2021-01-10: qty 1

## 2021-01-10 MED ORDER — LACTATED RINGERS IV BOLUS
1000.0000 mL | Freq: Once | INTRAVENOUS | Status: AC
  Administered 2021-01-10: 15:00:00 1000 mL via INTRAVENOUS

## 2021-01-10 MED ORDER — MORPHINE SULFATE (PF) 4 MG/ML IV SOLN
4.0000 mg | Freq: Once | INTRAVENOUS | Status: AC
  Administered 2021-01-10: 08:00:00 4 mg via INTRAVENOUS
  Filled 2021-01-10: qty 1

## 2021-01-10 MED ORDER — IOHEXOL 350 MG/ML SOLN
80.0000 mL | Freq: Once | INTRAVENOUS | Status: AC | PRN
  Administered 2021-01-10: 08:00:00 80 mL via INTRAVENOUS

## 2021-01-10 MED ORDER — ONDANSETRON HCL 4 MG/2ML IJ SOLN
4.0000 mg | Freq: Four times a day (QID) | INTRAMUSCULAR | Status: DC | PRN
  Administered 2021-01-10 – 2021-01-13 (×8): 4 mg via INTRAVENOUS
  Filled 2021-01-10 (×8): qty 2

## 2021-01-10 NOTE — ED Notes (Signed)
Pt informed of need for urine specimen. Pt encouraged to ambulate to the restroom. Pt does not want to. Pt states she would like to use the purewick instead. Pt c/o nothing to drink in several hours. Pt informed of NPO diet.

## 2021-01-10 NOTE — H&P (Signed)
History and Physical    Regina Robertson [id number] DOB: [birth year] DOA: 01/10/2021  PCP: Patient, No Pcp Per (Inactive)   Patient coming from: Home.   I have personally briefly reviewed patient's old medical records in [place]  Chief Complaint: Abdominal pain.   HPI: Regina Robertson is a 40 y.o. female with medical history significant of anxiety, bilateral chronic knee pain, thyroid disease, class II obesity who is coming to the emergency department with abdominal pain, nausea vomiting and diarrhea since yesterday after she was given an unknown medication for knee pain by a friend.  She has vomited multiple times and has several episodes of loose stool.  No melena or hematochezia.  No flank pain, dysuria, frequency or hematuria.  No fever, chills, rhinorrhea, sore throat, dyspnea, chest pain, palpitations, dizziness, diaphoresis, PND, orthopnea or pitting edema of the lower extremity.  No polyuria, polydipsia, polyphagia or blurred vision.  She stated she is no longer nauseous and would like to try something to eat.  I agree to try clear liquids this evening.  N.p.o. after midnight pending surgical reevaluation tomorrow morning.  ED Course: Initial vital signs were temperature 98.2 F, pulse 70, respirations 17, BP 167/111 and O2 sat 95% on room air.  The patient received 1000 mL of LR bolus, metoclopramide 10 mg IVP, hydromorphone 1 mg IVP, morphine 4 mg IVP, ondansetron 4 mg IVP and 1000 mL of NS bolus.  Lab work: Her urinalysis showed increase of specific gravity, small hemoglobinuria, ketonuria of 88 proteinuria 30 mg/dL.  Coronavirus and influenza PCR normal.  CBC showed a white count of 13.6, hemoglobin 11.9 g/dL platelets 409.  Lipase was normal.  CMP showed chloride of 97 mmol/L, the rest of the electrolytes were normal.  Glucose 177 and total protein 8.8 g/dL.  The rest of the CMP values were normal.  Imaging: CT abdomen/pelvis with contrast did not show any definite acute  findings or explanation for the patient's symptoms.  There was severe hepatic asteatosis.  Transvaginal ultrasound and abdominal pelvic Doppler did not show any acute findings.  Please see images and full radiology report for further details.  Review of Systems: As per HPI otherwise all other systems reviewed and are negative.  Past Medical History:  Diagnosis Date   Anxiety    Bilateral chronic knee pain    Thyroid disease    Past Surgical History:  Procedure Laterality Date   THYROIDECTOMY  2007   Social History  reports that she has quit smoking. She has never used smokeless tobacco. She reports current alcohol use. She reports that she does not use drugs.  No Known Allergies  Family History  Problem Relation Age of Onset   Cancer Father    Diabetes Father    Hypertension Father    Diabetes Other    Prior to Admission medications   Medication Sig Start Date End Date Taking? Authorizing Provider  azithromycin (ZITHROMAX Z-PAK) 250 MG tablet Take 1 tablet (250 mg total) by mouth daily. 10/14/20   Orpah Greek, MD  cefdinir (OMNICEF) 300 MG capsule Take 1 capsule (300 mg total) by mouth 2 (two) times daily. 10/14/20   Pollina, Gwenyth Allegra, MD  clonazePAM (KLONOPIN) 1 MG tablet Take 1 tablet (1 mg total) by mouth 2 (two) times daily as needed (spasms). 09/23/13   Tresa Garter, MD  ibuprofen (ADVIL,MOTRIN) 200 MG tablet Take 3 tablets (600 mg total) by mouth every 8 (eight) hours as needed for moderate pain. Patient not taking:  Reported on 11/20/2018 09/13/17   Dorie Rank, MD  meloxicam (MOBIC) 15 MG tablet Take 1 tablet (15 mg total) by mouth daily. Patient not taking: Reported on 01/18/2014 09/16/13   Gerda Diss, DO  ondansetron (ZOFRAN ODT) 4 MG disintegrating tablet Take 1 tablet (4 mg total) by mouth every 8 (eight) hours as needed for nausea. 11/20/18   Larene Pickett, PA-C  oxyCODONE (OXY IR/ROXICODONE) 5 MG immediate release tablet Take 1 tablet (5 mg  total) by mouth every 6 (six) hours as needed for moderate pain. Patient not taking: Reported on 09/13/2017 05/20/13   Velvet Bathe, MD  propranolol ER (INDERAL LA) 60 MG 24 hr capsule Take 1 capsule (60 mg total) by mouth daily. Patient not taking: Reported on 01/18/2014 09/04/13   Charlett Blake, MD   Physical Exam: Vitals:   01/10/21 1300 01/10/21 1417 01/10/21 1533 01/10/21 1551  BP: (!) 181/94 (!) 176/104 (!) 174/90   Pulse: 70 67 69   Resp: 18 17    Temp:  99.1 F (37.3 C)  98.8 F (37.1 C)  TempSrc:  Oral  Oral  SpO2: 99% 100% 97%   Weight:      Height:       Constitutional: NAD, calm, comfortable Eyes: PERRL, lids and conjunctivae normal ENMT: Mucous membranes are mildly dry.  Posterior pharynx clear of any exudate or lesions. Neck: normal, supple, no masses, no thyromegaly Respiratory: clear to auscultation bilaterally, no wheezing, no crackles. Normal respiratory effort. No accessory muscle use.  Cardiovascular: Regular rate and rhythm, no murmurs / rubs / gallops. No extremity edema. 2+ pedal pulses. No carotid bruits.  Abdomen: Obese, soft, positive RLQ tenderness, no guarding or rebound, masses palpated. No hepatosplenomegaly. Bowel sounds positive.  Musculoskeletal: no clubbing / cyanosis. Good ROM, no contractures. Normal muscle tone.  Skin: no acute rashes, lesions, ulcers on very limited dermatological examination. Neurologic: CN 2-12 grossly intact. Sensation intact, DTR normal. Strength 5/5 in all 4.  Psychiatric: Normal judgment and insight. Alert and oriented x 3. Normal mood.   Labs on Admission: I have personally reviewed following labs and imaging studies  CBC: Recent Labs  Lab 01/10/21 0613  WBC 13.6*  HGB 11.9*  HCT 38.7  MCV 76.3*  PLT 409*    Basic Metabolic Panel: Recent Labs  Lab 01/10/21 0613  NA 136  K 3.8  CL 97*  CO2 25  GLUCOSE 177*  BUN 12  CREATININE 0.77  CALCIUM 9.6    GFR: Estimated Creatinine Clearance: 129.6  mL/min (by C-G formula based on SCr of 0.77 mg/dL).  Liver Function Tests: Recent Labs  Lab 01/10/21 0613  AST 35  ALT 44  ALKPHOS 98  BILITOT 0.8  PROT 8.8*  ALBUMIN 4.1   Radiological Exams on Admission: US Transvaginal Non-OB  Result Date: 01/10/2021 CLINICAL DATA:  Right lower quadrant pain EXAM: TRANSABDOMINAL AND TRANSVAGINAL ULTRASOUND OF PELVIS DOPPLER ULTRASOUND OF OVARIES TECHNIQUE: Both transabdominal and transvaginal ultrasound examinations of the pelvis were performed. Transabdominal technique was performed for global imaging of the pelvis including uterus, ovaries, adnexal regions, and pelvic cul-de-sac. It was necessary to proceed with endovaginal exam following the transabdominal exam to visualize the uterus, endometrium, ovaries and adnexa. Color and duplex Doppler ultrasound was utilized to evaluate blood flow to the ovaries. COMPARISON:  CT earlier today FINDINGS: Uterus Measurements: 6.9 x 3.6 x 4.0 cm = volume: 52 mL. No fibroids or other mass visualized. Endometrium Thickness: Normal thickness, 4 mm.  No focal  abnormality visualized. Right ovary Measurements: 1.4 x 1.7 x 1.2 cm = volume: 1.5 mL. Normal appearance/no adnexal mass. Left ovary Measurements: Not visualized. No adnexal mass seen transabdominally. Pulsed Doppler evaluation of the right ovary demonstrates normal low-resistance arterial and venous waveforms. Other findings No abnormal free fluid. The study was terminated early due to patient vomiting. IMPRESSION: No acute findings seen. Left ovary not visualized due to termination of the study early due to patient vomiting. Electronically Signed   By: Charlett Nose M.D.   On: 01/10/2021 11:20   US Pelvis Complete  Result Date: 01/10/2021 CLINICAL DATA:  Right lower quadrant pain EXAM: TRANSABDOMINAL AND TRANSVAGINAL ULTRASOUND OF PELVIS DOPPLER ULTRASOUND OF OVARIES TECHNIQUE: Both transabdominal and transvaginal ultrasound examinations of the pelvis were  performed. Transabdominal technique was performed for global imaging of the pelvis including uterus, ovaries, adnexal regions, and pelvic cul-de-sac. It was necessary to proceed with endovaginal exam following the transabdominal exam to visualize the uterus, endometrium, ovaries and adnexa. Color and duplex Doppler ultrasound was utilized to evaluate blood flow to the ovaries. COMPARISON:  CT earlier today FINDINGS: Uterus Measurements: 6.9 x 3.6 x 4.0 cm = volume: 52 mL. No fibroids or other mass visualized. Endometrium Thickness: Normal thickness, 4 mm.  No focal abnormality visualized. Right ovary Measurements: 1.4 x 1.7 x 1.2 cm = volume: 1.5 mL. Normal appearance/no adnexal mass. Left ovary Measurements: Not visualized. No adnexal mass seen transabdominally. Pulsed Doppler evaluation of the right ovary demonstrates normal low-resistance arterial and venous waveforms. Other findings No abnormal free fluid. The study was terminated early due to patient vomiting. IMPRESSION: No acute findings seen. Left ovary not visualized due to termination of the study early due to patient vomiting. Electronically Signed   By: Charlett Nose M.D.   On: 01/10/2021 11:20   CT ABDOMEN PELVIS W CONTRAST  Result Date: 01/10/2021 CLINICAL DATA:  Right lower quadrant abdominal pain with nausea and vomiting after recent medication ingestion. Mild leukocytosis. EXAM: CT ABDOMEN AND PELVIS WITH CONTRAST TECHNIQUE: Multidetector CT imaging of the abdomen and pelvis was performed using the standard protocol following bolus administration of intravenous contrast. CONTRAST:  82mL OMNIPAQUE IOHEXOL 350 MG/ML SOLN COMPARISON:  None. FINDINGS: Lower chest: Clear lung bases. No significant pleural or pericardial effusion. Hepatobiliary: There is severe hepatic steatosis with focal fat adjacent to the falciform ligament. No suspicious focal hepatic abnormality or abnormal enhancement identified. No evidence of gallstones, gallbladder wall  thickening or biliary dilatation. Pancreas: Unremarkable. No pancreatic ductal dilatation or surrounding inflammatory changes. Spleen: Normal in size without focal abnormality. Adrenals/Urinary Tract: Both adrenal glands appear normal. The kidneys appear normal without evidence of urinary tract calculus, suspicious lesion or hydronephrosis. No bladder abnormalities are seen. Stomach/Bowel: No enteric contrast was administered. The stomach appears unremarkable for its degree of distension. No evidence of bowel wall thickening, distention or surrounding inflammatory change. The appendix is not definitely seen, although no definite pericecal inflammatory changes to suggest appendicitis. Vascular/Lymphatic: There are no enlarged abdominal or pelvic lymph nodes. No acute vascular findings. Minimal aortic atherosclerosis. The portal, superior mesenteric and splenic veins are patent. Reproductive: The uterus and ovaries appear unremarkable. No evidence of adnexal mass. Other: No evidence of abdominal wall mass or hernia. Trace free pelvic fluid, within physiologic limits. Musculoskeletal: No acute or significant osseous findings. Transitional lumbosacral anatomy. IMPRESSION: 1. No definite acute findings or explanation for the patient's symptoms. Bowel assessment is limited by the lack of enteric contrast, and the appendix is not definitely visualized,  although no pericecal inflammatory changes are identified to suggest appendicitis at this time. 2. Severe hepatic steatosis. Electronically Signed   By: Richardean Sale M.D.   On: 01/10/2021 09:13   [place] Art/Ven Flow Abd Pelv Doppler  Result Date: 01/10/2021 CLINICAL DATA:  Right lower quadrant pain EXAM: TRANSABDOMINAL AND TRANSVAGINAL ULTRASOUND OF PELVIS DOPPLER ULTRASOUND OF OVARIES TECHNIQUE: Both transabdominal and transvaginal ultrasound examinations of the pelvis were performed. Transabdominal technique was performed for global imaging of the pelvis including  uterus, ovaries, adnexal regions, and pelvic cul-de-sac. It was necessary to proceed with endovaginal exam following the transabdominal exam to visualize the uterus, endometrium, ovaries and adnexa. Color and duplex Doppler ultrasound was utilized to evaluate blood flow to the ovaries. COMPARISON:  CT earlier today FINDINGS: Uterus Measurements: 6.9 x 3.6 x 4.0 cm = volume: 52 mL. No fibroids or other mass visualized. Endometrium Thickness: Normal thickness, 4 mm.  No focal abnormality visualized. Right ovary Measurements: 1.4 x 1.7 x 1.2 cm = volume: 1.5 mL. Normal appearance/no adnexal mass. Left ovary Measurements: Not visualized. No adnexal mass seen transabdominally. Pulsed Doppler evaluation of the right ovary demonstrates normal low-resistance arterial and venous waveforms. Other findings No abnormal free fluid. The study was terminated early due to patient vomiting. IMPRESSION: No acute findings seen. Left ovary not visualized due to termination of the study early due to patient vomiting. Electronically Signed   By: Rolm Baptise M.D.   On: 01/10/2021 11:20    EKG: Independently reviewed.   Assessment/Plan Principal Problem:   Right lower quadrant abdominal pain Observation/MedSurg. Clear liquid diet. N.p.o. after midnight. Continue IV fluids. Analgesics as needed. Antiemetics as needed. Follow-up CBC and CMP in AM.  Active Problems:   Tremor Resume propranolol in AM. Metoprolol IV in the meantime for BP control.    Microcytic anemia Follow-up H&H.    Hyperglycemia Check fasting glucose in AM.  Needs to establish with a PCP. At risk for developing DM. At risk for development of liver cirrhosis. Lifestyle modifications and weight loss. Follow-up with PCP.    DVT prophylaxis: SCDs. Code Status:   Full code. Family Communication:   Disposition Plan:   Patient is from:  Home.  Anticipated DC to:  Home.    Anticipated DC date:  01/11/2021 or 01/12/2021.  Anticipated DC  barriers: Clinical status.  Consults called:  General surgery. Admission status:  Observation/MedSurg.  Severity of Illness:  High severity in the setting of intractable RLQ abdominal pain with no a specific etiology so far.  The patient will remain in the hospital for IV hydration and symptoms treatment.  Reubin Milan MD [facility]  How to contact the [facility] Attending or Consulting provider [place] or covering provider during after hours [place], for this patient?   Check the care team in [facility] and look for a) attending/consulting [facility] provider listed and b) the [facility] team listed Log into [email or web address] and use [place]'s universal password to access. If you do not have the password, please contact the hospital operator. Locate the [facility] provider you are looking for under [facility] and page to a number that you can be directly reached. If you still have difficulty reaching the provider, please page the [facility] (Director on Call) for the Hospitalists listed on amion for assistance.  01/10/2021, 4:42 PM   This document was prepared using Dragon voice recognition software and may contain some unintended transcription errors.

## 2021-01-10 NOTE — ED Notes (Signed)
Pt in bed with eyes closed, pt arouses easily to verbal stim. Pt reports a slight decrease in pain, pt requests a drink of water

## 2021-01-10 NOTE — ED Notes (Signed)
Pt in bed, pt moaning, pt c/o abd pain, pt has vomited on the floor.  Abd soft, tender, pt awaits md eval.

## 2021-01-10 NOTE — ED Notes (Signed)
Pt placed on purewick 

## 2021-01-10 NOTE — ED Notes (Signed)
Pt in bed, ultrasound at bedside, pt vomited a small amount of liquid, talked with pt, cool wash cloth given to dab face and back of neck, pt states this feels better.

## 2021-01-10 NOTE — ED Notes (Signed)
Pt in bed, pt reports 8/10 pain, ultra sound at bedside.

## 2021-01-10 NOTE — ED Notes (Signed)
ED TO INPATIENT HANDOFF REPORT  ED Nurse Name and Phone #: Ned Clines Name/Age/Gender [name] 40 y.o. female Room/Bed: WA11/WA11  Code Status   Code Status: Full Code  Home/SNF/Other Home Patient oriented to: self, place, time, and situation Is this baseline? Yes   Triage Complete: Triage complete  Chief Complaint Right lower quadrant abdominal pain [R10.31]  Triage Note Patient BIB EMS with complaint of lower abdominal pain with N/V. Pt reports a friend gave her an unknown medication last night for knee pain and symptoms began shortly after. Pt endorses weakness, able to walk.   EMS  BP 152/82 HR 70 SPO2 100% RA  CBG 208    Allergies No Known Allergies  Level of Care/Admitting Diagnosis ED Disposition     ED Disposition  Admit   Condition  --   Comment  Hospital Area: [facility] [[id number]]  Level of Care: Med-Surg [16]  May place patient in observation at [facility] or [facility] if equivalent level of care is available:: No  Covid Evaluation: Asymptomatic Screening Protocol (No Symptoms)  Diagnosis: Right lower quadrant abdominal pain [599357]  Admitting Physician: Bobette Mo [[id number]]  Attending Physician: Bobette Mo [[id number]]          B Medical/Surgery History Past Medical History:  Diagnosis Date   Anxiety    Bilateral chronic knee pain    Thyroid disease    Past Surgical History:  Procedure Laterality Date   THYROIDECTOMY  2007     A IV Location/Drains/Wounds Patient Lines/Drains/Airways Status     Active Line/Drains/Airways     Name Placement date Placement time Site Days   Peripheral IV 01/10/21 20 G Anterior;Left;Proximal Forearm 01/10/21  0756  Forearm  less than 1            Intake/Output Last 24 hours  Intake/Output Summary (Last 24 hours) at 01/10/2021 1548 Last data filed at 01/10/2021 1202 Gross per 24 hour  Intake 1000 ml  Output --  Net 1000 ml     Labs/Imaging Results for orders placed or performed during the hospital encounter of 01/10/21 (from the past 48 hour(s))  Lipase, blood     Status: None   Collection Time: 01/10/21  6:13 AM  Result Value Ref Range   Lipase 37 11 - 51 U/L    Comment: Performed at [facility], [address] [place], [place] [address]  Comprehensive metabolic panel     Status: Abnormal   Collection Time: 01/10/21  6:13 AM  Result Value Ref Range   Sodium 136 135 - 145 mmol/L   Potassium 3.8 3.5 - 5.1 mmol/L   Chloride 97 (L) 98 - 111 mmol/L   CO2 25 22 - 32 mmol/L   Glucose, Bld 177 (H) 70 - 99 mg/dL    Comment: Glucose reference range applies only to samples taken after fasting for at least 8 hours.   BUN 12 6 - 20 mg/dL   Creatinine, Ser 2.26 0.44 - 1.00 mg/dL   Calcium 9.6 8.9 - 33.3 mg/dL   Total Protein 8.8 (H) 6.5 - 8.1 g/dL   Albumin 4.1 3.5 - 5.0 g/dL   AST 35 15 - 41 U/L   ALT 44 0 - 44 U/L   Alkaline Phosphatase 98 38 - 126 U/L   Total Bilirubin 0.8 0.3 - 1.2 mg/dL   GFR, Estimated >54 >56 mL/min    Comment: (NOTE) Calculated using the CKD-EPI Creatinine Equation (2021)  Anion gap 14 5 - 15    Comment: Performed at [facility], [address] [place], [place] [address]  CBC     Status: Abnormal   Collection Time: 01/10/21  6:13 AM  Result Value Ref Range   WBC 13.6 (H) 4.0 - 10.5 K/uL   RBC 5.07 3.87 - 5.11 MIL/uL   Hemoglobin 11.9 (L) 12.0 - 15.0 g/dL   HCT 19.1 47.8 - 29.5 %   MCV 76.3 (L) 80.0 - 100.0 fL   MCH 23.5 (L) 26.0 - 34.0 pg   MCHC 30.7 30.0 - 36.0 g/dL   RDW 62.1 (H) 30.8 - 65.7 %   Platelets 409 (H) 150 - 400 K/uL   nRBC 0.0 0.0 - 0.2 %    Comment: Performed at [facility], [address], [place], [place] [address]  I-Stat beta hCG blood, ED     Status: None   Collection Time: 01/10/21  6:27 AM  Result Value Ref Range   I-stat hCG, quantitative <5.0 <5 mIU/mL   Comment 3            Comment:    GEST. AGE      CONC.  (mIU/mL)   <=1 WEEK        5 - 50     2 WEEKS       50 - 500     3 WEEKS       100 - 10,000     4 WEEKS     1,000 - 30,000        FEMALE AND NON-PREGNANT FEMALE:     LESS THAN 5 mIU/mL   Resp Panel by RT-PCR (Flu A&B, Covid) Nasopharyngeal Swab     Status: None   Collection Time: 01/10/21 11:59 AM   Specimen: Nasopharyngeal Swab; Nasopharyngeal(NP) swabs in vial transport medium  Result Value Ref Range   SARS Coronavirus 2 by RT PCR NEGATIVE NEGATIVE    Comment: (NOTE) SARS-CoV-2 target nucleic acids are NOT DETECTED.  The SARS-CoV-2 RNA is generally detectable in upper respiratory specimens during the acute phase of infection. The lowest concentration of SARS-CoV-2 viral copies this assay can detect is 138 copies/mL. A negative result does not preclude SARS-Cov-2 infection and should not be used as the sole basis for treatment or other patient management decisions. A negative result may occur with  improper specimen collection/handling, submission of specimen other than nasopharyngeal swab, presence of viral mutation(s) within the areas targeted by this assay, and inadequate number of viral copies(<138 copies/mL). A negative result must be combined with clinical observations, patient history, and epidemiological information. The expected result is Negative.  Fact Sheet for Patients:  BloggerCourse.com  Fact Sheet for Healthcare Providers:  SeriousBroker.it  This test is no t yet approved or cleared by the [place] FDA and  has been authorized for detection and/or diagnosis of SARS-CoV-2 by FDA under an Emergency Use Authorization (EUA). This EUA will remain  in effect (meaning this test can be used) for the duration of the COVID-19 declaration under Section 564(b)(1) of the Act, 21 U.S.C.section 360bbb-3(b)(1), unless the authorization is terminated  or revoked sooner.       Influenza A by PCR  NEGATIVE NEGATIVE   Influenza B by PCR NEGATIVE NEGATIVE    Comment: (NOTE) The Xpert Xpress SARS-CoV-2/FLU/RSV plus assay is intended as an aid in the diagnosis of influenza from Nasopharyngeal swab specimens and should not be used as a sole basis for treatment.  Nasal washings and aspirates are unacceptable for Xpert Xpress SARS-CoV-2/FLU/RSV testing.  Fact Sheet for Patients: BloggerCourse.com  Fact Sheet for Healthcare Providers: SeriousBroker.it  This test is not yet approved or cleared by the [place] FDA and has been authorized for detection and/or diagnosis of SARS-CoV-2 by FDA under an Emergency Use Authorization (EUA). This EUA will remain in effect (meaning this test can be used) for the duration of the COVID-19 declaration under Section 564(b)(1) of the Act, 21 U.S.C. section 360bbb-3(b)(1), unless the authorization is terminated or revoked.  Performed at [facility], [address] [place], [place] [address]    US Transvaginal Non-OB  Result Date: 01/10/2021 CLINICAL DATA:  Right lower quadrant pain EXAM: TRANSABDOMINAL AND TRANSVAGINAL ULTRASOUND OF PELVIS DOPPLER ULTRASOUND OF OVARIES TECHNIQUE: Both transabdominal and transvaginal ultrasound examinations of the pelvis were performed. Transabdominal technique was performed for global imaging of the pelvis including uterus, ovaries, adnexal regions, and pelvic cul-de-sac. It was necessary to proceed with endovaginal exam following the transabdominal exam to visualize the uterus, endometrium, ovaries and adnexa. Color and duplex Doppler ultrasound was utilized to evaluate blood flow to the ovaries. COMPARISON:  CT earlier today FINDINGS: Uterus Measurements: 6.9 x 3.6 x 4.0 cm = volume: 52 mL. No fibroids or other mass visualized. Endometrium Thickness: Normal thickness, 4 mm.  No focal abnormality visualized. Right ovary Measurements: 1.4 x 1.7 x 1.2  cm = volume: 1.5 mL. Normal appearance/no adnexal mass. Left ovary Measurements: Not visualized. No adnexal mass seen transabdominally. Pulsed Doppler evaluation of the right ovary demonstrates normal low-resistance arterial and venous waveforms. Other findings No abnormal free fluid. The study was terminated early due to patient vomiting. IMPRESSION: No acute findings seen. Left ovary not visualized due to termination of the study early due to patient vomiting. Electronically Signed   By: Charlett Nose M.D.   On: 01/10/2021 11:20   US Pelvis Complete  Result Date: 01/10/2021 CLINICAL DATA:  Right lower quadrant pain EXAM: TRANSABDOMINAL AND TRANSVAGINAL ULTRASOUND OF PELVIS DOPPLER ULTRASOUND OF OVARIES TECHNIQUE: Both transabdominal and transvaginal ultrasound examinations of the pelvis were performed. Transabdominal technique was performed for global imaging of the pelvis including uterus, ovaries, adnexal regions, and pelvic cul-de-sac. It was necessary to proceed with endovaginal exam following the transabdominal exam to visualize the uterus, endometrium, ovaries and adnexa. Color and duplex Doppler ultrasound was utilized to evaluate blood flow to the ovaries. COMPARISON:  CT earlier today FINDINGS: Uterus Measurements: 6.9 x 3.6 x 4.0 cm = volume: 52 mL. No fibroids or other mass visualized. Endometrium Thickness: Normal thickness, 4 mm.  No focal abnormality visualized. Right ovary Measurements: 1.4 x 1.7 x 1.2 cm = volume: 1.5 mL. Normal appearance/no adnexal mass. Left ovary Measurements: Not visualized. No adnexal mass seen transabdominally. Pulsed Doppler evaluation of the right ovary demonstrates normal low-resistance arterial and venous waveforms. Other findings No abnormal free fluid. The study was terminated early due to patient vomiting. IMPRESSION: No acute findings seen. Left ovary not visualized due to termination of the study early due to patient vomiting. Electronically Signed   By: Charlett Nose M.D.   On: 01/10/2021 11:20   CT ABDOMEN PELVIS W CONTRAST  Result Date: 01/10/2021 CLINICAL DATA:  Right lower quadrant abdominal pain with nausea and vomiting after recent medication ingestion. Mild leukocytosis. EXAM: CT ABDOMEN AND PELVIS WITH CONTRAST TECHNIQUE: Multidetector CT imaging of the abdomen and pelvis was performed using the standard protocol following bolus administration of intravenous contrast. CONTRAST:  62mL OMNIPAQUE IOHEXOL 350 MG/ML SOLN COMPARISON:  None. FINDINGS: Lower chest: Clear lung bases. No significant pleural or pericardial effusion. Hepatobiliary: There is severe hepatic steatosis with focal fat adjacent to the falciform ligament. No suspicious focal hepatic abnormality or abnormal enhancement identified. No evidence of gallstones, gallbladder wall thickening or biliary dilatation. Pancreas: Unremarkable. No pancreatic ductal dilatation or surrounding inflammatory changes. Spleen: Normal in size without focal abnormality. Adrenals/Urinary Tract: Both adrenal glands appear normal. The kidneys appear normal without evidence of urinary tract calculus, suspicious lesion or hydronephrosis. No bladder abnormalities are seen. Stomach/Bowel: No enteric contrast was administered. The stomach appears unremarkable for its degree of distension. No evidence of bowel wall thickening, distention or surrounding inflammatory change. The appendix is not definitely seen, although no definite pericecal inflammatory changes to suggest appendicitis. Vascular/Lymphatic: There are no enlarged abdominal or pelvic lymph nodes. No acute vascular findings. Minimal aortic atherosclerosis. The portal, superior mesenteric and splenic veins are patent. Reproductive: The uterus and ovaries appear unremarkable. No evidence of adnexal mass. Other: No evidence of abdominal wall mass or hernia. Trace free pelvic fluid, within physiologic limits. Musculoskeletal: No acute or significant osseous findings.  Transitional lumbosacral anatomy. IMPRESSION: 1. No definite acute findings or explanation for the patient's symptoms. Bowel assessment is limited by the lack of enteric contrast, and the appendix is not definitely visualized, although no pericecal inflammatory changes are identified to suggest appendicitis at this time. 2. Severe hepatic steatosis. Electronically Signed   By: Carey Bullocks M.D.   On: 01/10/2021 09:13   [place] Art/Ven Flow Abd Pelv Doppler  Result Date: 01/10/2021 CLINICAL DATA:  Right lower quadrant pain EXAM: TRANSABDOMINAL AND TRANSVAGINAL ULTRASOUND OF PELVIS DOPPLER ULTRASOUND OF OVARIES TECHNIQUE: Both transabdominal and transvaginal ultrasound examinations of the pelvis were performed. Transabdominal technique was performed for global imaging of the pelvis including uterus, ovaries, adnexal regions, and pelvic cul-de-sac. It was necessary to proceed with endovaginal exam following the transabdominal exam to visualize the uterus, endometrium, ovaries and adnexa. Color and duplex Doppler ultrasound was utilized to evaluate blood flow to the ovaries. COMPARISON:  CT earlier today FINDINGS: Uterus Measurements: 6.9 x 3.6 x 4.0 cm = volume: 52 mL. No fibroids or other mass visualized. Endometrium Thickness: Normal thickness, 4 mm.  No focal abnormality visualized. Right ovary Measurements: 1.4 x 1.7 x 1.2 cm = volume: 1.5 mL. Normal appearance/no adnexal mass. Left ovary Measurements: Not visualized. No adnexal mass seen transabdominally. Pulsed Doppler evaluation of the right ovary demonstrates normal low-resistance arterial and venous waveforms. Other findings No abnormal free fluid. The study was terminated early due to patient vomiting. IMPRESSION: No acute findings seen. Left ovary not visualized due to termination of the study early due to patient vomiting. Electronically Signed   By: Charlett Nose M.D.   On: 01/10/2021 11:20    Pending Labs Unresulted Labs (From admission, onward)      Start     Ordered   01/11/21 0500  HIV Antibody (routine testing w rflx)  (HIV Antibody (Routine testing w reflex) panel)  Tomorrow morning,   R        01/10/21 1457   01/11/21 0500  Comprehensive metabolic panel  Tomorrow morning,   R        01/10/21 1457   01/11/21 0500  CBC  Tomorrow morning,   R        01/10/21 1457   01/10/21 0613  Urinalysis, Routine w reflex microscopic  Once,   STAT  01/10/21 0612            Vitals/Pain Today's Vitals   01/10/21 1202 01/10/21 1300 01/10/21 1417 01/10/21 1533  BP: (!) 146/94 (!) 181/94 (!) 176/104 (!) 174/90  Pulse: 78 70 67 69  Resp: 17 18 17    Temp:   99.1 F (37.3 C)   TempSrc:   Oral   SpO2: 100% 99% 100% 97%  Weight:      Height:      PainSc: Asleep  5      Isolation Precautions No active isolations  Medications Medications  sodium chloride (PF) 0.9 % injection (has no administration in time range)  pantoprazole (PROTONIX) injection 40 mg (40 mg Intravenous Given 01/10/21 1527)  acetaminophen (TYLENOL) tablet 650 mg (has no administration in time range)    Or  acetaminophen (TYLENOL) suppository 650 mg (has no administration in time range)  ondansetron (ZOFRAN) tablet 4 mg (has no administration in time range)    Or  ondansetron (ZOFRAN) injection 4 mg (has no administration in time range)  HYDROmorphone (DILAUDID) injection 1 mg (has no administration in time range)  morphine 4 MG/ML injection 4 mg (4 mg Intravenous Given 01/10/21 0801)  ondansetron (ZOFRAN) injection 4 mg (4 mg Intravenous Given 01/10/21 0759)  sodium chloride 0.9 % bolus 1,000 mL (0 mLs Intravenous Stopped 01/10/21 1202)  iohexol (OMNIPAQUE) 350 MG/ML injection 80 mL (80 mLs Intravenous Contrast Given 01/10/21 0828)  metoCLOPramide (REGLAN) injection 10 mg (10 mg Intravenous Given 01/10/21 1029)  HYDROmorphone (DILAUDID) injection 1 mg (1 mg Intravenous Given 01/10/21 1029)  lactated ringers bolus 1,000 mL (1,000 mLs Intravenous New Bag/Given  01/10/21 1527)    Mobility walks with device Low fall risk     R Recommendations: See Admitting Provider Note  Report given to:   Additional Notes:

## 2021-01-10 NOTE — ED Notes (Signed)
Pt in bed with eyes closed, pt arouses to verbal stim, replaced blood pressure cuff, pt reports that her pain is a 5/10 and is OK, states that she has no requests at this time.

## 2021-01-10 NOTE — ED Triage Notes (Signed)
Patient BIB EMS with complaint of lower abdominal pain with N/V. Pt reports a friend gave her an unknown medication last night for knee pain and symptoms began shortly after. Pt endorses weakness, able to walk.   EMS  BP 152/82 HR 70 SPO2 100% RA  CBG 208

## 2021-01-10 NOTE — Consult Note (Signed)
Consult Note  Regina Robertson 15-Jan-1981  [id number].    Requesting MD: Pricilla Loveless, MD Chief Complaint/Reason for Consult: abdominal pain with nausea and vomiting  HPI:  Patient is a 40 year old female who presented to [facility] with abdominal pain. Pain started last night. Cannot recall what she ate yesterday. The pain started randomly. It is RLQ and associated with multiple episodes of nausea, vomiting, and diarrhea. She states that she has had this before but cannot tell me when or how often. Per notes her symptoms began after taking a medication from a friend for her chronic knee pain, but she does not tell me this. She denies fever, chills, back pain, vaginal symptoms, or urinary symptoms. No known sick contacts. Patient unsure when she had her LMP.  PMH otherwise significant for anxiety and hx of thyroid disease. No prior abdominal surgery. NKDA.   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  Respiratory:  Negative for shortness of breath and wheezing.   Cardiovascular:  Negative for chest pain and palpitations.  Gastrointestinal:  Positive for abdominal pain, diarrhea, nausea and vomiting.  Genitourinary:  Negative for dysuria, frequency and urgency.  Musculoskeletal:  Positive for joint pain (chronic knee pain).  Neurological:  Positive for weakness.   Family History  Problem Relation Age of Onset   Cancer Father    Diabetes Father    Hypertension Father    Diabetes Other     Past Medical History:  Diagnosis Date   Anxiety    Bilateral chronic knee pain    Thyroid disease     Past Surgical History:  Procedure Laterality Date   THYROIDECTOMY  2007    Social History:  reports that she has quit smoking. She has never used smokeless tobacco. She reports current alcohol use. She reports that she does not use drugs.  Allergies:  No Known Allergies  (Not in a hospital admission)   Blood pressure (!) 146/94, pulse 78, temperature 99.7 F (37.6 C), temperature source Oral, resp. rate 17, height 5\' 11"  (1.803 m), weight 117.9 kg, SpO2 100 %. Physical Exam:  General: WD, overweight female who is laying in bed in NAD, lethargic HEENT: head is normocephalic, atraumatic.  Sclera are noninjected.  Pupils equal and round.  Ears and nose without any masses or lesions.  Mouth is pink and moist Heart: regular, rate, and rhythm.  Normal s1,s2. No obvious murmurs, gallops, or rubs noted.  Palpable radial and pedal pulses bilaterally Lungs: CTAB, no wheezes, rhonchi, or rales noted.  Respiratory effort nonlabored Abd: soft, mild RLQ TTP without rebound or guarding, abdomen otherwise nontender, ND, +BS, no masses, hernias, or organomegaly MS: all 4 extremities are symmetrical with no cyanosis, clubbing, or edema. Skin: warm and dry with no masses, lesions, or rashes Neuro: Cranial nerves 2-12 grossly intact, sensation is normal throughout Psych: A&Ox3 with an appropriate affect.   Results for orders placed or performed during the hospital encounter of 01/10/21 (from the past 48 hour(s))  Lipase, blood     Status: None   Collection Time: 01/10/21  6:13 AM  Result Value Ref Range   Lipase 37 11 - 51 U/L    Comment: Performed at [facility], [address] [place], [place]  Comprehensive metabolic panel     Status: Abnormal   Collection Time: 01/10/21  6:13 AM  Result Value Ref Range   Sodium 136 135 - 145 mmol/L   Potassium 3.8 3.5 - 5.1 mmol/L   Chloride 97 (L) 98 - 111 mmol/L   CO2 25 22 - 32 mmol/L   Glucose, Bld 177 (H) 70 - 99 mg/dL    Comment: Glucose reference range applies only to samples taken after fasting for at least 8 hours.   BUN 12 6 - 20 mg/dL   Creatinine, Ser 8.85 0.44 - 1.00 mg/dL   Calcium 9.6 8.9 - 02.7 mg/dL   Total Protein 8.8 (H) 6.5 - 8.1 g/dL   Albumin 4.1 3.5 - 5.0  g/dL   AST 35 15 - 41 U/L   ALT 44 0 - 44 U/L   Alkaline Phosphatase 98 38 - 126 U/L   Total Bilirubin 0.8 0.3 - 1.2 mg/dL   GFR, Estimated >74 >12 mL/min    Comment: (NOTE) Calculated using the CKD-EPI Creatinine Equation (2021)    Anion gap 14 5 - 15    Comment: Performed at [facility] : [facility], [address], [place], [place] [address]  CBC     Status: Abnormal   Collection Time: 01/10/21  6:13 AM  Result Value Ref Range   WBC 13.6 (H) 4.0 - 10.5 K/uL   RBC 5.07 3.87 - 5.11 MIL/uL   Hemoglobin 11.9 (L) 12.0 - 15.0 g/dL   HCT 67.2 09.4 - 70.9 %   MCV 76.3 (L) 80.0 - 100.0 fL   MCH 23.5 (L) 26.0 - 34.0 pg   MCHC 30.7 30.0 - 36.0 g/dL   RDW 62.8 (H) 36.6 - 29.4 %   Platelets 409 (H) 150 - 400 K/uL   nRBC 0.0 0.0 - 0.2 %    Comment: Performed at [facility], [address], [place], [place] [address]  I-Stat beta hCG blood, ED     Status: None   Collection Time: 01/10/21  6:27 AM  Result Value Ref Range   I-stat hCG, quantitative <5.0 <5 mIU/mL   Comment 3            Comment:   GEST. AGE      CONC.  (mIU/mL)   <=1 WEEK        5 - 50     2 WEEKS       50 - 500     3 WEEKS       100 - 10,000     4 WEEKS     1,000 - 30,000        FEMALE AND NON-PREGNANT FEMALE:     LESS THAN 5 mIU/mL    US Transvaginal Non-OB  Result Date: 01/10/2021 CLINICAL DATA:  Right lower quadrant pain EXAM: TRANSABDOMINAL AND TRANSVAGINAL ULTRASOUND OF PELVIS DOPPLER ULTRASOUND OF OVARIES TECHNIQUE: Both transabdominal and transvaginal ultrasound examinations of the pelvis were performed. Transabdominal technique was performed for global imaging of the pelvis including uterus, ovaries, adnexal regions, and pelvic cul-de-sac. It was necessary to proceed with endovaginal exam following the transabdominal exam to visualize the uterus, endometrium, ovaries and adnexa. Color and duplex Doppler ultrasound was utilized to evaluate blood flow to the ovaries. COMPARISON:  CT earlier  today FINDINGS: Uterus Measurements: 6.9 x 3.6 x 4.0 cm = volume: 52 mL. No fibroids or other mass visualized. Endometrium Thickness: Normal thickness, 4 mm.  No focal abnormality visualized. Right ovary Measurements: 1.4 x 1.7 x 1.2 cm = volume: 1.5 mL. Normal appearance/no adnexal mass. Left ovary  Measurements: Not visualized. No adnexal mass seen transabdominally. Pulsed Doppler evaluation of the right ovary demonstrates normal low-resistance arterial and venous waveforms. Other findings No abnormal free fluid. The study was terminated early due to patient vomiting. IMPRESSION: No acute findings seen. Left ovary not visualized due to termination of the study early due to patient vomiting. Electronically Signed   By: Charlett Nose M.D.   On: 01/10/2021 11:20   US Pelvis Complete  Result Date: 01/10/2021 CLINICAL DATA:  Right lower quadrant pain EXAM: TRANSABDOMINAL AND TRANSVAGINAL ULTRASOUND OF PELVIS DOPPLER ULTRASOUND OF OVARIES TECHNIQUE: Both transabdominal and transvaginal ultrasound examinations of the pelvis were performed. Transabdominal technique was performed for global imaging of the pelvis including uterus, ovaries, adnexal regions, and pelvic cul-de-sac. It was necessary to proceed with endovaginal exam following the transabdominal exam to visualize the uterus, endometrium, ovaries and adnexa. Color and duplex Doppler ultrasound was utilized to evaluate blood flow to the ovaries. COMPARISON:  CT earlier today FINDINGS: Uterus Measurements: 6.9 x 3.6 x 4.0 cm = volume: 52 mL. No fibroids or other mass visualized. Endometrium Thickness: Normal thickness, 4 mm.  No focal abnormality visualized. Right ovary Measurements: 1.4 x 1.7 x 1.2 cm = volume: 1.5 mL. Normal appearance/no adnexal mass. Left ovary Measurements: Not visualized. No adnexal mass seen transabdominally. Pulsed Doppler evaluation of the right ovary demonstrates normal low-resistance arterial and venous waveforms. Other findings No  abnormal free fluid. The study was terminated early due to patient vomiting. IMPRESSION: No acute findings seen. Left ovary not visualized due to termination of the study early due to patient vomiting. Electronically Signed   By: Charlett Nose M.D.   On: 01/10/2021 11:20   CT ABDOMEN PELVIS W CONTRAST  Result Date: 01/10/2021 CLINICAL DATA:  Right lower quadrant abdominal pain with nausea and vomiting after recent medication ingestion. Mild leukocytosis. EXAM: CT ABDOMEN AND PELVIS WITH CONTRAST TECHNIQUE: Multidetector CT imaging of the abdomen and pelvis was performed using the standard protocol following bolus administration of intravenous contrast. CONTRAST:  70mL OMNIPAQUE IOHEXOL 350 MG/ML SOLN COMPARISON:  None. FINDINGS: Lower chest: Clear lung bases. No significant pleural or pericardial effusion. Hepatobiliary: There is severe hepatic steatosis with focal fat adjacent to the falciform ligament. No suspicious focal hepatic abnormality or abnormal enhancement identified. No evidence of gallstones, gallbladder wall thickening or biliary dilatation. Pancreas: Unremarkable. No pancreatic ductal dilatation or surrounding inflammatory changes. Spleen: Normal in size without focal abnormality. Adrenals/Urinary Tract: Both adrenal glands appear normal. The kidneys appear normal without evidence of urinary tract calculus, suspicious lesion or hydronephrosis. No bladder abnormalities are seen. Stomach/Bowel: No enteric contrast was administered. The stomach appears unremarkable for its degree of distension. No evidence of bowel wall thickening, distention or surrounding inflammatory change. The appendix is not definitely seen, although no definite pericecal inflammatory changes to suggest appendicitis. Vascular/Lymphatic: There are no enlarged abdominal or pelvic lymph nodes. No acute vascular findings. Minimal aortic atherosclerosis. The portal, superior mesenteric and splenic veins are patent. Reproductive: The  uterus and ovaries appear unremarkable. No evidence of adnexal mass. Other: No evidence of abdominal wall mass or hernia. Trace free pelvic fluid, within physiologic limits. Musculoskeletal: No acute or significant osseous findings. Transitional lumbosacral anatomy. IMPRESSION: 1. No definite acute findings or explanation for the patient's symptoms. Bowel assessment is limited by the lack of enteric contrast, and the appendix is not definitely visualized, although no pericecal inflammatory changes are identified to suggest appendicitis at this time. 2. Severe hepatic steatosis. Electronically Signed  By: Carey Bullocks M.D.   On: 01/10/2021 09:13   [place] Art/Ven Flow Abd Pelv Doppler  Result Date: 01/10/2021 CLINICAL DATA:  Right lower quadrant pain EXAM: TRANSABDOMINAL AND TRANSVAGINAL ULTRASOUND OF PELVIS DOPPLER ULTRASOUND OF OVARIES TECHNIQUE: Both transabdominal and transvaginal ultrasound examinations of the pelvis were performed. Transabdominal technique was performed for global imaging of the pelvis including uterus, ovaries, adnexal regions, and pelvic cul-de-sac. It was necessary to proceed with endovaginal exam following the transabdominal exam to visualize the uterus, endometrium, ovaries and adnexa. Color and duplex Doppler ultrasound was utilized to evaluate blood flow to the ovaries. COMPARISON:  CT earlier today FINDINGS: Uterus Measurements: 6.9 x 3.6 x 4.0 cm = volume: 52 mL. No fibroids or other mass visualized. Endometrium Thickness: Normal thickness, 4 mm.  No focal abnormality visualized. Right ovary Measurements: 1.4 x 1.7 x 1.2 cm = volume: 1.5 mL. Normal appearance/no adnexal mass. Left ovary Measurements: Not visualized. No adnexal mass seen transabdominally. Pulsed Doppler evaluation of the right ovary demonstrates normal low-resistance arterial and venous waveforms. Other findings No abnormal free fluid. The study was terminated early due to patient vomiting. IMPRESSION: No acute  findings seen. Left ovary not visualized due to termination of the study early due to patient vomiting. Electronically Signed   By: Charlett Nose M.D.   On: 01/10/2021 11:20      Assessment/Plan RLQ abdominal pain of unknown etiology Nausea, vomiting, diarrhea - CT today without definite acute findings, appendix not definitely visualized but no pericecal inflammatory changes to suggest appendicitis; ovaries unremarkable; severe hepatic steatosis - WBC 13, Afebrile, hypertensive - Unsure as to the cause of her symptoms but would not recommend any acute surgical intervention. Recommend medical admission for observation, symptom management, rehydration. We will follow. If symptoms do not resolve could consider repeat CT scan with oral contrast.  Chronic bilateral knee pain Anxiety Hx of thyroid disease s/p thyroidectomy in 2007  [facility] 01/10/2021, 1:01 PM Please see Amion for pager number during day hours 7:00am-4:30pm

## 2021-01-10 NOTE — ED Provider Notes (Signed)
[facility]-EMERGENCY DEPT Provider Note   CSN: [id number] Arrival date & time: 01/10/21  0603     History Chief Complaint  Patient presents with   Abdominal Pain    Niaomi Zirbel is a 40 y.o. female.  HPI 40 year old female presents with abdominal pain.  She has also had numerous episodes of vomiting.  She states it started after she was given some medication for her knee pain and then shortly thereafter started developing the symptoms.  She feels generally weak.  She denies any fevers, back pain, urinary or vaginal symptoms.  She has had some diarrhea.  The pain is progressively worsened and is diffuse across her lower abdomen.  Past Medical History:  Diagnosis Date   Anxiety    Bilateral chronic knee pain    Thyroid disease     Patient Active Problem List   Diagnosis Date Noted   Right lower quadrant abdominal pain 01/10/2021   Tremor 05/18/2013   Anemia 05/18/2013   Knee pain 05/18/2013   Tremors of nervous system 05/18/2013    Past Surgical History:  Procedure Laterality Date   THYROIDECTOMY  2007     OB History   No obstetric history on file.     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   Drug use: No    Home Medications Prior to Admission medications   Medication Sig Start Date End Date Taking? Authorizing Provider  azithromycin (ZITHROMAX Z-PAK) 250 MG tablet Take 1 tablet (250 mg total) by mouth daily. 10/14/20   Gilda Crease, MD  cefdinir (OMNICEF) 300 MG capsule Take 1 capsule (300 mg total) by mouth 2 (two) times daily. 10/14/20   Pollina, Canary Brim, MD  clonazePAM (KLONOPIN) 1 MG tablet  Take 1 tablet (1 mg total) by mouth 2 (two) times daily as needed (spasms). 09/23/13   Quentin Angst, MD  ibuprofen (ADVIL,MOTRIN) 200 MG tablet Take 3 tablets (600 mg total) by mouth every 8 (eight) hours as needed for moderate pain. Patient not taking: Reported on 11/20/2018 09/13/17   Linwood Dibbles, MD  meloxicam (MOBIC) 15 MG tablet Take 1 tablet (15 mg total) by mouth daily. Patient not taking: Reported on 01/18/2014 09/16/13   Andrena Mews, DO  ondansetron (ZOFRAN ODT) 4 MG disintegrating tablet Take 1 tablet (4 mg total) by mouth every 8 (eight) hours as needed for nausea. 11/20/18   Garlon Hatchet, PA-C  oxyCODONE (OXY IR/ROXICODONE) 5 MG immediate release tablet Take 1 tablet (5 mg total) by mouth every 6 (six) hours as needed for moderate pain. Patient not taking: Reported on 09/13/2017 05/20/13   Penny Pia, MD  propranolol ER (INDERAL LA) 60 MG 24 hr capsule Take 1 capsule (60 mg total) by mouth daily. Patient not taking: Reported on 01/18/2014 09/04/13   Erick Colace, MD    Allergies    Patient has no known allergies.  Review of Systems   Review of Systems  Gastrointestinal:  Positive for abdominal pain, diarrhea, nausea and vomiting.  Genitourinary:  Negative for dysuria and vaginal bleeding.  Musculoskeletal:  Negative for back pain.  All other systems reviewed and are negative.  Physical Exam Updated Vital Signs BP (!) 176/104 (BP Location: Right Arm)    Pulse 67    Temp 99.1 F (37.3 C) (Oral)    Resp 17    Ht 5\' 11"  (1.803 m)    Wt 117.9 kg    SpO2 100%    BMI 36.26 kg/m   Physical Exam Vitals and nursing note reviewed.  Constitutional:      Appearance: She is well-developed. She is not ill-appearing or diaphoretic.  HENT:     Head: Normocephalic and atraumatic.     Right Ear: External ear normal.     Left Ear: External ear normal.     Nose: Nose normal.  Eyes:     General:        Right eye: No discharge.        Left eye: No discharge.   Cardiovascular:     Rate and Rhythm: Normal rate and regular rhythm.     Heart sounds: Normal heart sounds.  Pulmonary:     Effort: Pulmonary effort is normal.     Breath sounds: Normal breath sounds.  Abdominal:     Palpations: Abdomen is soft.     Tenderness: There is abdominal tenderness (RLQ>LLQ) in the right lower quadrant, suprapubic area and left lower quadrant. There is no right CVA tenderness or left CVA tenderness.  Skin:    General: Skin is warm and dry.  Neurological:     Mental Status: She is alert.  Psychiatric:        Mood and Affect: Mood is not anxious.    ED Results / Procedures / Treatments   Labs (all labs ordered are listed, but only abnormal results are displayed) Labs Reviewed  COMPREHENSIVE METABOLIC PANEL - Abnormal; Notable for the following components:      Result Value   Chloride 97 (*)    Glucose, Bld 177 (*)    Total Protein 8.8 (*)    All other components within normal limits  CBC - Abnormal; Notable for the following components:   WBC 13.6 (*)    Hemoglobin 11.9 (*)    MCV 76.3 (*)    MCH 23.5 (*)    RDW 18.6 (*)    Platelets 409 (*)    All other components within normal limits  RESP PANEL BY RT-PCR (FLU A&B, COVID) ARPGX2  LIPASE, BLOOD  URINALYSIS, ROUTINE W REFLEX MICROSCOPIC  I-STAT BETA HCG BLOOD, ED ([facility], [facility], [facility] ONLY)    EKG None  Radiology Transvaginal Non-OB  Result Date: 01/10/2021 CLINICAL DATA:  Right lower quadrant pain EXAM: TRANSABDOMINAL AND TRANSVAGINAL ULTRASOUND OF PELVIS DOPPLER ULTRASOUND OF OVARIES TECHNIQUE: Both transabdominal and transvaginal ultrasound examinations of the pelvis were performed. Transabdominal technique was performed for global imaging of the pelvis including uterus, ovaries, adnexal regions, and pelvic cul-de-sac. It was necessary to proceed with endovaginal exam following the transabdominal exam to visualize the uterus, endometrium, ovaries and adnexa. Color and duplex Doppler ultrasound was  utilized to evaluate blood flow to the ovaries. COMPARISON:  CT earlier today FINDINGS: Uterus Measurements: 6.9 x 3.6 x 4.0 cm = volume: 52 mL. No fibroids or other mass visualized. Endometrium Thickness: Normal thickness, 4 mm.  No focal abnormality visualized. Right ovary Measurements: 1.4 x 1.7 x 1.2 cm = volume: 1.5 mL. Normal appearance/no adnexal mass. Left ovary Measurements: Not visualized. No adnexal mass seen transabdominally. Pulsed Doppler evaluation of the  right ovary demonstrates normal low-resistance arterial and venous waveforms. Other findings No abnormal free fluid. The study was terminated early due to patient vomiting. IMPRESSION: No acute findings seen. Left ovary not visualized due to termination of the study early due to patient vomiting. Electronically Signed   By: Charlett Nose M.D.   On: 01/10/2021 11:20   US Pelvis Complete  Result Date: 01/10/2021 CLINICAL DATA:  Right lower quadrant pain EXAM: TRANSABDOMINAL AND TRANSVAGINAL ULTRASOUND OF PELVIS DOPPLER ULTRASOUND OF OVARIES TECHNIQUE: Both transabdominal and transvaginal ultrasound examinations of the pelvis were performed. Transabdominal technique was performed for global imaging of the pelvis including uterus, ovaries, adnexal regions, and pelvic cul-de-sac. It was necessary to proceed with endovaginal exam following the transabdominal exam to visualize the uterus, endometrium, ovaries and adnexa. Color and duplex Doppler ultrasound was utilized to evaluate blood flow to the ovaries. COMPARISON:  CT earlier today FINDINGS: Uterus Measurements: 6.9 x 3.6 x 4.0 cm = volume: 52 mL. No fibroids or other mass visualized. Endometrium Thickness: Normal thickness, 4 mm.  No focal abnormality visualized. Right ovary Measurements: 1.4 x 1.7 x 1.2 cm = volume: 1.5 mL. Normal appearance/no adnexal mass. Left ovary Measurements: Not visualized. No adnexal mass seen transabdominally. Pulsed Doppler evaluation of the right ovary demonstrates  normal low-resistance arterial and venous waveforms. Other findings No abnormal free fluid. The study was terminated early due to patient vomiting. IMPRESSION: No acute findings seen. Left ovary not visualized due to termination of the study early due to patient vomiting. Electronically Signed   By: Charlett Nose M.D.   On: 01/10/2021 11:20   CT ABDOMEN PELVIS W CONTRAST  Result Date: 01/10/2021 CLINICAL DATA:  Right lower quadrant abdominal pain with nausea and vomiting after recent medication ingestion. Mild leukocytosis. EXAM: CT ABDOMEN AND PELVIS WITH CONTRAST TECHNIQUE: Multidetector CT imaging of the abdomen and pelvis was performed using the standard protocol following bolus administration of intravenous contrast. CONTRAST:  80mL OMNIPAQUE IOHEXOL 350 MG/ML SOLN COMPARISON:  None. FINDINGS: Lower chest: Clear lung bases. No significant pleural or pericardial effusion. Hepatobiliary: There is severe hepatic steatosis with focal fat adjacent to the falciform ligament. No suspicious focal hepatic abnormality or abnormal enhancement identified. No evidence of gallstones, gallbladder wall thickening or biliary dilatation. Pancreas: Unremarkable. No pancreatic ductal dilatation or surrounding inflammatory changes. Spleen: Normal in size without focal abnormality. Adrenals/Urinary Tract: Both adrenal glands appear normal. The kidneys appear normal without evidence of urinary tract calculus, suspicious lesion or hydronephrosis. No bladder abnormalities are seen. Stomach/Bowel: No enteric contrast was administered. The stomach appears unremarkable for its degree of distension. No evidence of bowel wall thickening, distention or surrounding inflammatory change. The appendix is not definitely seen, although no definite pericecal inflammatory changes to suggest appendicitis. Vascular/Lymphatic: There are no enlarged abdominal or pelvic lymph nodes. No acute vascular findings. Minimal aortic atherosclerosis. The  portal, superior mesenteric and splenic veins are patent. Reproductive: The uterus and ovaries appear unremarkable. No evidence of adnexal mass. Other: No evidence of abdominal wall mass or hernia. Trace free pelvic fluid, within physiologic limits. Musculoskeletal: No acute or significant osseous findings. Transitional lumbosacral anatomy. IMPRESSION: 1. No definite acute findings or explanation for the patient's symptoms. Bowel assessment is limited by the lack of enteric contrast, and the appendix is not definitely visualized, although no pericecal inflammatory changes are identified to suggest appendicitis at this time. 2. Severe hepatic steatosis. Electronically Signed   By: Carey Bullocks M.D.   On: 01/10/2021 09:13   [place]  Art/Ven Flow Abd Pelv Doppler  Result Date: 01/10/2021 CLINICAL DATA:  Right lower quadrant pain EXAM: TRANSABDOMINAL AND TRANSVAGINAL ULTRASOUND OF PELVIS DOPPLER ULTRASOUND OF OVARIES TECHNIQUE: Both transabdominal and transvaginal ultrasound examinations of the pelvis were performed. Transabdominal technique was performed for global imaging of the pelvis including uterus, ovaries, adnexal regions, and pelvic cul-de-sac. It was necessary to proceed with endovaginal exam following the transabdominal exam to visualize the uterus, endometrium, ovaries and adnexa. Color and duplex Doppler ultrasound was utilized to evaluate blood flow to the ovaries. COMPARISON:  CT earlier today FINDINGS: Uterus Measurements: 6.9 x 3.6 x 4.0 cm = volume: 52 mL. No fibroids or other mass visualized. Endometrium Thickness: Normal thickness, 4 mm.  No focal abnormality visualized. Right ovary Measurements: 1.4 x 1.7 x 1.2 cm = volume: 1.5 mL. Normal appearance/no adnexal mass. Left ovary Measurements: Not visualized. No adnexal mass seen transabdominally. Pulsed Doppler evaluation of the right ovary demonstrates normal low-resistance arterial and venous waveforms. Other findings No abnormal free fluid. The  study was terminated early due to patient vomiting. IMPRESSION: No acute findings seen. Left ovary not visualized due to termination of the study early due to patient vomiting. Electronically Signed   By: Charlett Nose M.D.   On: 01/10/2021 11:20    Procedures Procedures   Medications Ordered in ED Medications  sodium chloride (PF) 0.9 % injection (has no administration in time range)  morphine 4 MG/ML injection 4 mg (4 mg Intravenous Given 01/10/21 0801)  ondansetron (ZOFRAN) injection 4 mg (4 mg Intravenous Given 01/10/21 0759)  sodium chloride 0.9 % bolus 1,000 mL (0 mLs Intravenous Stopped 01/10/21 1202)  iohexol (OMNIPAQUE) 350 MG/ML injection 80 mL (80 mLs Intravenous Contrast Given 01/10/21 0828)  metoCLOPramide (REGLAN) injection 10 mg (10 mg Intravenous Given 01/10/21 1029)  HYDROmorphone (DILAUDID) injection 1 mg (1 mg Intravenous Given 01/10/21 1029)    ED Course  I have reviewed the triage vital signs and the nursing notes.  Pertinent labs & imaging results that were available during my care of the patient were reviewed by me and considered in my medical decision making (see chart for details).    MDM Rules/Calculators/A&P                         Patient continues to have right lower quadrant pain.  Versus seem to be right lower quadrant greater than left but now it seems to be focally right lower quadrant.  With her leukocytosis there is obviously some concern for appendicitis.  Unfortunately the CT cannot see the appendix and while there is no obvious secondary signs, there are also no other obvious explanations.  She is continued to be symptomatic and continuing to vomit.  General surgery has been consulted and seen and advises to be observed on the medical service and they will follow along and consider repeat imaging tomorrow.  She is otherwise stable.  Dr. Robb Matar to admit.    Final Clinical Impression(s) / ED Diagnoses Final diagnoses:  Right lower quadrant abdominal  pain    Rx / DC Orders ED Discharge Orders     None        Pricilla Loveless, MD 01/10/21 1456

## 2021-01-11 ENCOUNTER — Observation Stay (HOSPITAL_COMMUNITY): Payer: Medicaid Other

## 2021-01-11 DIAGNOSIS — E890 Postprocedural hypothyroidism: Secondary | ICD-10-CM | POA: Diagnosis present

## 2021-01-11 DIAGNOSIS — G8929 Other chronic pain: Secondary | ICD-10-CM | POA: Diagnosis present

## 2021-01-11 DIAGNOSIS — R739 Hyperglycemia, unspecified: Secondary | ICD-10-CM

## 2021-01-11 DIAGNOSIS — Z8249 Family history of ischemic heart disease and other diseases of the circulatory system: Secondary | ICD-10-CM | POA: Diagnosis not present

## 2021-01-11 DIAGNOSIS — R197 Diarrhea, unspecified: Secondary | ICD-10-CM | POA: Diagnosis present

## 2021-01-11 DIAGNOSIS — Z6836 Body mass index (BMI) 36.0-36.9, adult: Secondary | ICD-10-CM | POA: Diagnosis not present

## 2021-01-11 DIAGNOSIS — E876 Hypokalemia: Secondary | ICD-10-CM | POA: Diagnosis present

## 2021-01-11 DIAGNOSIS — M25562 Pain in left knee: Secondary | ICD-10-CM | POA: Diagnosis present

## 2021-01-11 DIAGNOSIS — R251 Tremor, unspecified: Secondary | ICD-10-CM | POA: Diagnosis not present

## 2021-01-11 DIAGNOSIS — D649 Anemia, unspecified: Secondary | ICD-10-CM | POA: Diagnosis not present

## 2021-01-11 DIAGNOSIS — R1084 Generalized abdominal pain: Secondary | ICD-10-CM | POA: Diagnosis not present

## 2021-01-11 DIAGNOSIS — I10 Essential (primary) hypertension: Secondary | ICD-10-CM | POA: Diagnosis present

## 2021-01-11 DIAGNOSIS — Z28310 Unvaccinated for covid-19: Secondary | ICD-10-CM | POA: Diagnosis not present

## 2021-01-11 DIAGNOSIS — Z79899 Other long term (current) drug therapy: Secondary | ICD-10-CM | POA: Diagnosis not present

## 2021-01-11 DIAGNOSIS — Z20822 Contact with and (suspected) exposure to covid-19: Secondary | ICD-10-CM | POA: Diagnosis present

## 2021-01-11 DIAGNOSIS — E669 Obesity, unspecified: Secondary | ICD-10-CM

## 2021-01-11 DIAGNOSIS — E119 Type 2 diabetes mellitus without complications: Secondary | ICD-10-CM | POA: Diagnosis not present

## 2021-01-11 DIAGNOSIS — K838 Other specified diseases of biliary tract: Secondary | ICD-10-CM | POA: Diagnosis present

## 2021-01-11 DIAGNOSIS — R160 Hepatomegaly, not elsewhere classified: Secondary | ICD-10-CM | POA: Diagnosis present

## 2021-01-11 DIAGNOSIS — E785 Hyperlipidemia, unspecified: Secondary | ICD-10-CM | POA: Diagnosis present

## 2021-01-11 DIAGNOSIS — N83209 Unspecified ovarian cyst, unspecified side: Secondary | ICD-10-CM | POA: Diagnosis not present

## 2021-01-11 DIAGNOSIS — R5383 Other fatigue: Secondary | ICD-10-CM

## 2021-01-11 DIAGNOSIS — E1165 Type 2 diabetes mellitus with hyperglycemia: Secondary | ICD-10-CM | POA: Diagnosis present

## 2021-01-11 DIAGNOSIS — Z833 Family history of diabetes mellitus: Secondary | ICD-10-CM | POA: Diagnosis not present

## 2021-01-11 DIAGNOSIS — K760 Fatty (change of) liver, not elsewhere classified: Secondary | ICD-10-CM | POA: Diagnosis present

## 2021-01-11 DIAGNOSIS — R1031 Right lower quadrant pain: Secondary | ICD-10-CM | POA: Diagnosis present

## 2021-01-11 DIAGNOSIS — M25561 Pain in right knee: Secondary | ICD-10-CM | POA: Diagnosis present

## 2021-01-11 DIAGNOSIS — N83201 Unspecified ovarian cyst, right side: Secondary | ICD-10-CM | POA: Diagnosis present

## 2021-01-11 DIAGNOSIS — R112 Nausea with vomiting, unspecified: Secondary | ICD-10-CM | POA: Diagnosis present

## 2021-01-11 DIAGNOSIS — Z87891 Personal history of nicotine dependence: Secondary | ICD-10-CM | POA: Diagnosis not present

## 2021-01-11 DIAGNOSIS — D509 Iron deficiency anemia, unspecified: Secondary | ICD-10-CM | POA: Diagnosis present

## 2021-01-11 LAB — RAPID URINE DRUG SCREEN, HOSP PERFORMED
Amphetamines: NOT DETECTED
Barbiturates: NOT DETECTED
Benzodiazepines: NOT DETECTED
Cocaine: NOT DETECTED
Opiates: POSITIVE — AB
Tetrahydrocannabinol: NOT DETECTED

## 2021-01-11 LAB — COMPREHENSIVE METABOLIC PANEL
ALT: 34 U/L (ref 0–44)
AST: 23 U/L (ref 15–41)
Albumin: 3.9 g/dL (ref 3.5–5.0)
Alkaline Phosphatase: 86 U/L (ref 38–126)
Anion gap: 12 (ref 5–15)
BUN: 8 mg/dL (ref 6–20)
CO2: 27 mmol/L (ref 22–32)
Calcium: 9.3 mg/dL (ref 8.9–10.3)
Chloride: 95 mmol/L — ABNORMAL LOW (ref 98–111)
Creatinine, Ser: 0.64 mg/dL (ref 0.44–1.00)
GFR, Estimated: >60 mL/min (ref >60)
Glucose, Bld: 118 mg/dL — ABNORMAL HIGH (ref 70–99)
Potassium: 3.2 mmol/L — ABNORMAL LOW (ref 3.5–5.1)
Sodium: 134 mmol/L — ABNORMAL LOW (ref 135–145)
Total Bilirubin: 1 mg/dL (ref 0.3–1.2)
Total Protein: 8.6 g/dL — ABNORMAL HIGH (ref 6.5–8.1)

## 2021-01-11 LAB — LIPID PANEL
Cholesterol: 166 mg/dL (ref 0–200)
HDL: 25 mg/dL — ABNORMAL LOW (ref >40)
LDL Cholesterol: 130 mg/dL — ABNORMAL HIGH (ref 0–99)
Total CHOL/HDL Ratio: 6.6 RATIO
Triglycerides: 55 mg/dL (ref <150)
VLDL: 11 mg/dL (ref 0–40)

## 2021-01-11 LAB — CBC
HCT: 36.6 % (ref 36.0–46.0)
Hemoglobin: 11.8 g/dL — ABNORMAL LOW (ref 12.0–15.0)
MCH: 24.4 pg — ABNORMAL LOW (ref 26.0–34.0)
MCHC: 32.2 g/dL (ref 30.0–36.0)
MCV: 75.6 fL — ABNORMAL LOW (ref 80.0–100.0)
Platelets: 380 10*3/uL (ref 150–400)
RBC: 4.84 MIL/uL (ref 3.87–5.11)
RDW: 18.1 % — ABNORMAL HIGH (ref 11.5–15.5)
WBC: 13 10*3/uL — ABNORMAL HIGH (ref 4.0–10.5)
nRBC: 0 % (ref 0.0–0.2)

## 2021-01-11 LAB — HEMOGLOBIN A1C
Hgb A1c MFr Bld: 6.6 % — ABNORMAL HIGH (ref 4.8–5.6)
Mean Plasma Glucose: 142.72 mg/dL

## 2021-01-11 LAB — HIV ANTIBODY (ROUTINE TESTING W REFLEX): HIV Screen 4th Generation wRfx: NONREACTIVE

## 2021-01-11 MED ORDER — KCL IN DEXTROSE-NACL 20-5-0.45 MEQ/L-%-% IV SOLN: INTRAVENOUS | Status: DC

## 2021-01-11 MED ORDER — IOHEXOL 9 MG/ML PO SOLN
500.0000 mL | ORAL | Status: AC
  Administered 2021-01-11 (×2): 500 mL via ORAL

## 2021-01-11 MED ORDER — POTASSIUM CHLORIDE 10 MEQ/100ML IV SOLN
10.0000 meq | INTRAVENOUS | Status: AC
  Administered 2021-01-11 (×6): 10 meq via INTRAVENOUS
  Filled 2021-01-11 (×2): qty 100

## 2021-01-11 MED ORDER — HYDRALAZINE HCL 20 MG/ML IJ SOLN
5.0000 mg | Freq: Four times a day (QID) | INTRAMUSCULAR | Status: DC
  Administered 2021-01-11 – 2021-01-13 (×7): 5 mg via INTRAVENOUS
  Filled 2021-01-11 (×7): qty 1

## 2021-01-11 MED ORDER — DEXTROSE-NACL 5-0.45 % IV SOLN: INTRAVENOUS | Status: DC

## 2021-01-11 MED ORDER — SODIUM CHLORIDE 0.9 % IV SOLN
12.5000 mg | Freq: Once | INTRAVENOUS | Status: AC
  Administered 2021-01-11: 16:00:00 12.5 mg via INTRAVENOUS
  Filled 2021-01-11 (×3): qty 0.5

## 2021-01-11 MED ORDER — IOHEXOL 9 MG/ML PO SOLN
ORAL | Status: AC
  Administered 2021-01-11: 11:00:00 500 mL
  Filled 2021-01-11: qty 1000

## 2021-01-11 MED ORDER — SODIUM CHLORIDE (PF) 0.9 % IJ SOLN
INTRAMUSCULAR | Status: AC
  Filled 2021-01-11: qty 50

## 2021-01-11 MED ORDER — IOHEXOL 350 MG/ML SOLN
100.0000 mL | Freq: Once | INTRAVENOUS | Status: AC | PRN
  Administered 2021-01-11: 14:00:00 100 mL via INTRAVENOUS

## 2021-01-11 NOTE — Progress Notes (Signed)
Patient refused to be connected with continuous  pulse oximeter.

## 2021-01-11 NOTE — Progress Notes (Signed)
This RN spoke with patient's nurse to notify that patient does not want another IV at this time. This RN also spoke with Dr. Joseph Art regarding plan for IVF/IV medication and preferred IV access. Dr. Joseph Art is requesting a midline. This RN consulted another IV Team member regarding midline placement.

## 2021-01-11 NOTE — Progress Notes (Signed)
°  Transition of Care (TOC) Screening Note   Patient Details  Name: Karmela Bram Date of Birth: [birth year]   Transition of Care [facility]) CM/SW Contact:    Amada Jupiter, LCSW Phone Number: 01/11/2021, 3:25 PM    Transition of Care Department [facility]) has reviewed patient and no TOC needs have been identified at this time. We will continue to monitor patient advancement through interdisciplinary progression rounds. If new patient transition needs arise, please place a TOC consult.  Bryson Palen, LCSW

## 2021-01-11 NOTE — Progress Notes (Signed)
Pt declines to drink Omnipaque stating "I will just throw it up"

## 2021-01-11 NOTE — Progress Notes (Signed)
Follow up CT with PO contrast negative for acute appendicitis. CT with small to moderate free fluid in right adnexa and posterior cul-de-sac, may represent ruptured cyst. Fine to have a diet as tolerated from a surgical standpoint. No role for general surgery at this time, we will sign off. Consider outpatient GYN follow up prn.   Juliet Rude, [facility] 01/11/2021, 3:35 PM Please see Amion for pager number during day hours 7:00am-4:30pm

## 2021-01-11 NOTE — Progress Notes (Signed)
Patient ID: Regina Robertson, female   DOB: [birth year], 40 y.o.   MRN: [id number]   Acute Care Surgery Service Progress Note:    Chief Complaint/Subjective: Not really talkative.  Difficult to get history from pt this am States she threw up several times Still with some RLQ pain Denies diarrhea/bm  Objective: Vital signs in last 24 hours: Temp:  [97.9 F (36.6 C)-99.7 F (37.6 C)] 98.1 F (36.7 C) (12/21 0625) Pulse Rate:  [57-78] 60 (12/21 0625) Resp:  [16-18] 18 (12/21 0625) BP: (146-198)/(70-104) 177/98 (12/21 0625) SpO2:  [97 %-100 %] 100 % (12/21 0625) Last BM Date: 01/09/21  Intake/Output from previous day: 12/20 0701 - 12/21 0700 In: 1360 [P.O.:360; IV Piggyback:1000] Out: 800 [Urine:800] Intake/Output this shift: No intake/output data recorded.  Lungs: cta, nonlabored  Cardiovascular: reg  Abd: soft, nd, min TTP RLQ; def no rebound/guarding/peritonitis  Extremities: no edema, +SCDs  Neuro: a little lethargic, nonfocal, completely flat affect  Lab Results: CBC  Recent Labs    01/10/21 0613 01/11/21 0438  WBC 13.6* 13.0*  HGB 11.9* 11.8*  HCT 38.7 36.6  PLT 409* 380   BMET Recent Labs    01/10/21 0613 01/11/21 0438  NA 136 134*  K 3.8 3.2*  CL 97* 95*  CO2 25 27  GLUCOSE 177* 118*  BUN 12 8  CREATININE 0.77 0.64  CALCIUM 9.6 9.3   LFT Hepatic Function Latest Ref Rng & Units 01/11/2021 01/10/2021 11/19/2018  Total Protein 6.5 - 8.1 g/dL 8.6(H) 8.8(H) 8.9(H)  Albumin 3.5 - 5.0 g/dL 3.9 4.1 4.6  AST 15 - 41 U/L 23 35 16  ALT 0 - 44 U/L 34 44 15  Alk Phosphatase 38 - 126 U/L 86 98 86  Total Bilirubin 0.3 - 1.2 mg/dL 1.0 0.8 0.6   PT/INR No results for input(s): LABPROT, INR in the last 72 hours. ABG No results for input(s): PHART, HCO3 in the last 72 hours.  Invalid input(s): PCO2, PO2  Studies/Results:  Anti-infectives: Anti-infectives (From admission, onward)    None       Medications: Scheduled Meds:  hydrALAZINE  5 mg  Intravenous Q6H   metoprolol tartrate  5 mg Intravenous Q8H   pantoprazole (PROTONIX) IV  40 mg Intravenous Daily   Continuous Infusions:  dextrose 5 % and 0.45 % NaCl with KCl 20 mEq/L     dextrose 5 % and 0.45% NaCl     potassium chloride     PRN Meds:.acetaminophen **OR** acetaminophen, clonazePAM, HYDROmorphone (DILAUDID) injection, ondansetron **OR** ondansetron (ZOFRAN) IV  Assessment/Plan: Patient Active Problem List   Diagnosis Date Noted   Right lower quadrant abdominal pain 01/10/2021   Hyperglycemia 01/10/2021   Tremor 05/18/2013   Microcytic anemia 05/18/2013   Knee pain 05/18/2013   Tremors of nervous system 05/18/2013   RLQ abdominal pain of unknown etiology Nausea, vomiting, diarrhea CT today without definite acute findings, appendix not definitely visualized but no pericecal inflammatory changes to suggest appendicitis; ovaries unremarkable; severe hepatic steatosis - WBC 13, Afebrile, hypertensive, not tachy -abd exam improved -the vomiting she c/o - only had mucous in BS bags. I think it is more retching -I don't think she has appendicitis. However, her flat affect and limited participation make it a little challenging.   Will repeat CT with oral contrast If negative - will give CLD   Disposition:  LOS: 0 days    Leighton Ruff. Redmond Pulling, MD, FACS General, Bariatric, & Minimally Invasive Surgery [phone] [facility]

## 2021-01-11 NOTE — Progress Notes (Signed)
[facility] RN contacted regarding midline placement for this patient at the request of physician. Contacted Dr. Joseph Art via SecureChat regarding midlines in our enterprise; advised we do not have double lumen midlines and we don't recommend potassium runs be given through midlines d/t depth of vessels and increased time to detect possible extravasation. Further advised, if patient needs further access at this time an USGIV could be placed by current [facility] RN if appropriate vessels are noted. Dr. Joseph Art stated patient needs second IV access at this time. [facility] RN on [facility] to assess patient's vasculature if patient is willing.

## 2021-01-11 NOTE — Progress Notes (Signed)
PROGRESS NOTE    Quandra Fedorchak  HKV:[id number] DOB: 05-Aug-1980 DOA: 01/10/2021 PCP: Patient, No Pcp Per (Inactive)   Brief Narrative:   40 y.o. BF PMHx Anxiety, bilateral chronic knee pain, thyroid disease, class II obesity   Admitted with abdominal pain, nausea vomiting and diarrhea since yesterday after she was given an unknown medication for knee pain by a friend.  She has vomited multiple times and has several episodes of loose stool.  No melena or hematochezia.  No flank pain, dysuria, frequency or hematuria.  No fever, chills, rhinorrhea, sore throat, dyspnea, chest pain, palpitations, dizziness, diaphoresis, PND, orthopnea or pitting edema of the lower extremity.  No polyuria, polydipsia, polyphagia or blurred vision.  She stated she is no longer nauseous and would like to try something to eat.  I agree to try clear liquids this evening.  N.p.o. after midnight pending surgical reevaluation tomorrow morning.   ED Course: Initial vital signs were temperature 98.2 F, pulse 70, respirations 17, BP 167/111 and O2 sat 95% on room air.  The patient received 1000 mL of LR bolus, metoclopramide 10 mg IVP, hydromorphone 1 mg IVP, morphine 4 mg IVP, ondansetron 4 mg IVP and 1000 mL of NS bolus.   Lab work: Her urinalysis showed increase of specific gravity, small hemoglobinuria, ketonuria of 88 proteinuria 30 mg/dL.  Coronavirus and influenza PCR normal.  CBC showed a white count of 13.6, hemoglobin 11.9 g/dL platelets 564.  Lipase was normal.  CMP showed chloride of 97 mmol/L, the rest of the electrolytes were normal.  Glucose 177 and total protein 8.8 g/dL.  The rest of the CMP values were normal.   Imaging: CT abdomen/pelvis with contrast did not show any definite acute findings or explanation for the patient's symptoms.  There was severe hepatic asteatosis.  Transvaginal ultrasound and abdominal pelvic Doppler did not show any acute findings.  Please see images and full radiology report for  further details.   Subjective: Patient lethargic, A/O x4   Assessment & Plan:  Covid vaccination; unvaccinated  Principal Problem:   Right lower quadrant abdominal pain Active Problems:   Tremor   Microcytic anemia   Hyperglycemia  Right lower quadrant abdominal pain -N.p.o. - 12/21 CT abdomen pelvis pending  -12/21 surgery consult pending    Tremor - Hold propranolol given patient's heart rate.  Essential HTN - Metoprolol IV 5 mg TID -12/21 Hydralazine 5 mg QID   Anemia unspecified - 12/21 anemia panel pending    Hyperglycemia -12/21 hemoglobin A1c pending - 12/21 lipid panel pending    Obese (BMI 36.26kg/m) -  Hypokalemia - Potassium goal> 4 - 12/21 Potassium IV 60 mEq  Lethargic - 12/21 urine tox screen pending  Goals of care - 12/21 consult LCSW: Needs to establish with a PCP.    DVT prophylaxis: SCD Code Status: Full Family Communication:  Status is: Inpatient    Dispo: The patient is from: Home              Anticipated d/c is to: Home              Anticipated d/c date is: 2 days              Patient currently is not medically stable to d/c.      Consultants:  Surgery  Procedures/Significant Events:     I have personally reviewed and interpreted all radiology studies and my findings are as above.  VENTILATOR SETTINGS:    Cultures   Antimicrobials: Anti-infectives (  From admission, onward)    None         Devices    LINES / TUBES:      Continuous Infusions:   Objective: Vitals:   01/10/21 1642 01/10/21 2152 01/11/21 0126 01/11/21 0625  BP: (!) 181/97 (!) 169/90 (!) 161/70 (!) 177/98  Pulse: 64 63 (!) 57 60  Resp: 16 18 18 18   Temp: 99.1 F (37.3 C) 99.2 F (37.3 C) 97.9 F (36.6 C) 98.1 F (36.7 C)  TempSrc: Oral Oral Oral Oral  SpO2: 100% 97% 100% 100%  Weight:      Height:        Intake/Output Summary (Last 24 hours) at 01/11/2021 0753 Last data filed at 01/11/2021 [id number] Gross per 24 hour   Intake 1360 ml  Output 800 ml  Net 560 ml   Filed Weights   01/10/21 0616  Weight: 117.9 kg    Examination:  General: A/O x4, uncomfortable, No acute respiratory distress Eyes: negative scleral hemorrhage, negative anisocoria, negative icterus ENT: Negative Runny nose, negative gingival bleeding, Neck:  Negative scars, masses, torticollis, lymphadenopathy, JVD Lungs: Clear to auscultation bilaterally without wheezes or crackles Cardiovascular: Regular rate and rhythm without murmur gallop or rub normal S1 and S2 Abdomen: OBESE, negative abdominal pain, nondistended, positive soft, bowel sounds, no rebound, no ascites, no appreciable mass Extremities: No significant cyanosis, clubbing, or edema bilateral lower extremities Skin: Negative rashes, lesions, ulcers Psychiatric:  Negative depression, negative anxiety, negative fatigue, negative mania  Central nervous system:  Cranial nerves II through XII intact, tongue/uvula midline, all extremities muscle strength 5/5, sensation intact throughout, negative dysarthria, negative expressive aphasia, negative receptive aphasia.  .     Data Reviewed: Care during the described time interval was provided by me .  I have reviewed this patient's available data, including medical history, events of note, physical examination, and all test results as part of my evaluation.   CBC: Recent Labs  Lab 01/10/21 0613 01/11/21 0438  WBC 13.6* 13.0*  HGB 11.9* 11.8*  HCT 38.7 36.6  MCV 76.3* 75.6*  PLT 409* [id number]   Basic Metabolic Panel: Recent Labs  Lab 01/10/21 0613 01/11/21 0438  NA 136 134*  K 3.8 3.2*  CL 97* 95*  CO2 25 27  GLUCOSE 177* 118*  BUN 12 8  CREATININE 0.77 0.64  CALCIUM 9.6 9.3   GFR: Estimated Creatinine Clearance: 129.6 mL/min (by C-G formula based on SCr of 0.64 mg/dL). Liver Function Tests: Recent Labs  Lab 01/10/21 [id number] 01/11/21 0438  AST 35 23  ALT 44 34  ALKPHOS 98 86  BILITOT 0.8 1.0  PROT 8.8* 8.6*   ALBUMIN 4.1 3.9   Recent Labs  Lab 01/10/21 0613  LIPASE 37   No results for input(s): AMMONIA in the last 168 hours. Coagulation Profile: No results for input(s): INR, PROTIME in the last 168 hours. Cardiac Enzymes: No results for input(s): CKTOTAL, CKMB, CKMBINDEX, TROPONINI in the last 168 hours. BNP (last 3 results) No results for input(s): PROBNP in the last 8760 hours. HbA1C: No results for input(s): HGBA1C in the last 72 hours. CBG: No results for input(s): GLUCAP in the last 168 hours. Lipid Profile: No results for input(s): CHOL, HDL, LDLCALC, TRIG, CHOLHDL, LDLDIRECT in the last 72 hours. Thyroid Function Tests: No results for input(s): TSH, T4TOTAL, FREET4, T3FREE, THYROIDAB in the last 72 hours. Anemia Panel: No results for input(s): VITAMINB12, FOLATE, FERRITIN, TIBC, IRON, RETICCTPCT in the last 72 hours. Urine analysis:  Component Value Date/Time   COLORURINE YELLOW 01/10/2021 1533   APPEARANCEUR CLEAR 01/10/2021 1533   LABSPEC >1.046 (H) 01/10/2021 1533   PHURINE 6.0 01/10/2021 1533   GLUCOSEU NEGATIVE 01/10/2021 1533   HGBUR SMALL (A) 01/10/2021 1533   BILIRUBINUR NEGATIVE 01/10/2021 1533   KETONESUR 80 (A) 01/10/2021 1533   PROTEINUR 30 (A) 01/10/2021 1533   NITRITE NEGATIVE 01/10/2021 1533   LEUKOCYTESUR NEGATIVE 01/10/2021 1533   Sepsis Labs: @LABRCNTIP (procalcitonin:4,lacticidven:4)  ) Recent Results (from the past 240 hour(s))  Resp Panel by RT-PCR (Flu A&B, Covid) Nasopharyngeal Swab     Status: None   Collection Time: 01/10/21 11:59 AM   Specimen: Nasopharyngeal Swab; Nasopharyngeal(NP) swabs in vial transport medium  Result Value Ref Range Status   SARS Coronavirus 2 by RT PCR NEGATIVE NEGATIVE Final    Comment: (NOTE) SARS-CoV-2 target nucleic acids are NOT DETECTED.  The SARS-CoV-2 RNA is generally detectable in upper respiratory specimens during the acute phase of infection. The lowest concentration of SARS-CoV-2 viral copies this  assay can detect is 138 copies/mL. A negative result does not preclude SARS-Cov-2 infection and should not be used as the sole basis for treatment or other patient management decisions. A negative result may occur with  improper specimen collection/handling, submission of specimen other than nasopharyngeal swab, presence of viral mutation(s) within the areas targeted by this assay, and inadequate number of viral copies(<138 copies/mL). A negative result must be combined with clinical observations, patient history, and epidemiological information. The expected result is Negative.  Fact Sheet for Patients:  EntrepreneurPulse.com.au  Fact Sheet for Healthcare Providers:  IncredibleEmployment.be  This test is no t yet approved or cleared by the [place] FDA and  has been authorized for detection and/or diagnosis of SARS-CoV-2 by FDA under an Emergency Use Authorization (EUA). This EUA will remain  in effect (meaning this test can be used) for the duration of the COVID-19 declaration under Section 564(b)(1) of the Act, 21 U.S.C.section 360bbb-3(b)(1), unless the authorization is terminated  or revoked sooner.       Influenza A by PCR NEGATIVE NEGATIVE Final   Influenza B by PCR NEGATIVE NEGATIVE Final    Comment: (NOTE) The Xpert Xpress SARS-CoV-2/FLU/RSV plus assay is intended as an aid in the diagnosis of influenza from Nasopharyngeal swab specimens and should not be used as a sole basis for treatment. Nasal washings and aspirates are unacceptable for Xpert Xpress SARS-CoV-2/FLU/RSV testing.  Fact Sheet for Patients: EntrepreneurPulse.com.au  Fact Sheet for Healthcare Providers: IncredibleEmployment.be  This test is not yet approved or cleared by the [place] FDA and has been authorized for detection and/or diagnosis of SARS-CoV-2 by FDA under an Emergency Use Authorization (EUA). This EUA will  remain in effect (meaning this test can be used) for the duration of the COVID-19 declaration under Section 564(b)(1) of the Act, 21 U.S.C. section 360bbb-3(b)(1), unless the authorization is terminated or revoked.  Performed at [facility], [place] [address], [place], [place] [address]          Radiology Studies: US Transvaginal Non-OB  Result Date: 01/10/2021 CLINICAL DATA:  Right lower quadrant pain EXAM: TRANSABDOMINAL AND TRANSVAGINAL ULTRASOUND OF PELVIS DOPPLER ULTRASOUND OF OVARIES TECHNIQUE: Both transabdominal and transvaginal ultrasound examinations of the pelvis were performed. Transabdominal technique was performed for global imaging of the pelvis including uterus, ovaries, adnexal regions, and pelvic cul-de-sac. It was necessary to proceed with endovaginal exam following the transabdominal exam to visualize the uterus, endometrium, ovaries and adnexa. Color and  duplex Doppler ultrasound was utilized to evaluate blood flow to the ovaries. COMPARISON:  CT earlier today FINDINGS: Uterus Measurements: 6.9 x 3.6 x 4.0 cm = volume: 52 mL. No fibroids or other mass visualized. Endometrium Thickness: Normal thickness, 4 mm.  No focal abnormality visualized. Right ovary Measurements: 1.4 x 1.7 x 1.2 cm = volume: 1.5 mL. Normal appearance/no adnexal mass. Left ovary Measurements: Not visualized. No adnexal mass seen transabdominally. Pulsed Doppler evaluation of the right ovary demonstrates normal low-resistance arterial and venous waveforms. Other findings No abnormal free fluid. The study was terminated early due to patient vomiting. IMPRESSION: No acute findings seen. Left ovary not visualized due to termination of the study early due to patient vomiting. Electronically Signed   By: Rolm Baptise M.D.   On: 01/10/2021 11:20   US Pelvis Complete  Result Date: 01/10/2021 CLINICAL DATA:  Right lower quadrant pain EXAM: TRANSABDOMINAL AND TRANSVAGINAL ULTRASOUND OF PELVIS  DOPPLER ULTRASOUND OF OVARIES TECHNIQUE: Both transabdominal and transvaginal ultrasound examinations of the pelvis were performed. Transabdominal technique was performed for global imaging of the pelvis including uterus, ovaries, adnexal regions, and pelvic cul-de-sac. It was necessary to proceed with endovaginal exam following the transabdominal exam to visualize the uterus, endometrium, ovaries and adnexa. Color and duplex Doppler ultrasound was utilized to evaluate blood flow to the ovaries. COMPARISON:  CT earlier today FINDINGS: Uterus Measurements: 6.9 x 3.6 x 4.0 cm = volume: 52 mL. No fibroids or other mass visualized. Endometrium Thickness: Normal thickness, 4 mm.  No focal abnormality visualized. Right ovary Measurements: 1.4 x 1.7 x 1.2 cm = volume: 1.5 mL. Normal appearance/no adnexal mass. Left ovary Measurements: Not visualized. No adnexal mass seen transabdominally. Pulsed Doppler evaluation of the right ovary demonstrates normal low-resistance arterial and venous waveforms. Other findings No abnormal free fluid. The study was terminated early due to patient vomiting. IMPRESSION: No acute findings seen. Left ovary not visualized due to termination of the study early due to patient vomiting. Electronically Signed   By: Rolm Baptise M.D.   On: 01/10/2021 11:20   CT ABDOMEN PELVIS W CONTRAST  Result Date: 01/10/2021 CLINICAL DATA:  Right lower quadrant abdominal pain with nausea and vomiting after recent medication ingestion. Mild leukocytosis. EXAM: CT ABDOMEN AND PELVIS WITH CONTRAST TECHNIQUE: Multidetector CT imaging of the abdomen and pelvis was performed using the standard protocol following bolus administration of intravenous contrast. CONTRAST:  58mL OMNIPAQUE IOHEXOL 350 MG/ML SOLN COMPARISON:  None. FINDINGS: Lower chest: Clear lung bases. No significant pleural or pericardial effusion. Hepatobiliary: There is severe hepatic steatosis with focal fat adjacent to the falciform ligament. No  suspicious focal hepatic abnormality or abnormal enhancement identified. No evidence of gallstones, gallbladder wall thickening or biliary dilatation. Pancreas: Unremarkable. No pancreatic ductal dilatation or surrounding inflammatory changes. Spleen: Normal in size without focal abnormality. Adrenals/Urinary Tract: Both adrenal glands appear normal. The kidneys appear normal without evidence of urinary tract calculus, suspicious lesion or hydronephrosis. No bladder abnormalities are seen. Stomach/Bowel: No enteric contrast was administered. The stomach appears unremarkable for its degree of distension. No evidence of bowel wall thickening, distention or surrounding inflammatory change. The appendix is not definitely seen, although no definite pericecal inflammatory changes to suggest appendicitis. Vascular/Lymphatic: There are no enlarged abdominal or pelvic lymph nodes. No acute vascular findings. Minimal aortic atherosclerosis. The portal, superior mesenteric and splenic veins are patent. Reproductive: The uterus and ovaries appear unremarkable. No evidence of adnexal mass. Other: No evidence of abdominal wall mass or hernia. Trace  free pelvic fluid, within physiologic limits. Musculoskeletal: No acute or significant osseous findings. Transitional lumbosacral anatomy. IMPRESSION: 1. No definite acute findings or explanation for the patient's symptoms. Bowel assessment is limited by the lack of enteric contrast, and the appendix is not definitely visualized, although no pericecal inflammatory changes are identified to suggest appendicitis at this time. 2. Severe hepatic steatosis. Electronically Signed   By: Richardean Sale M.D.   On: 01/10/2021 09:13   [place] Art/Ven Flow Abd Pelv Doppler  Result Date: 01/10/2021 CLINICAL DATA:  Right lower quadrant pain EXAM: TRANSABDOMINAL AND TRANSVAGINAL ULTRASOUND OF PELVIS DOPPLER ULTRASOUND OF OVARIES TECHNIQUE: Both transabdominal and transvaginal ultrasound examinations  of the pelvis were performed. Transabdominal technique was performed for global imaging of the pelvis including uterus, ovaries, adnexal regions, and pelvic cul-de-sac. It was necessary to proceed with endovaginal exam following the transabdominal exam to visualize the uterus, endometrium, ovaries and adnexa. Color and duplex Doppler ultrasound was utilized to evaluate blood flow to the ovaries. COMPARISON:  CT earlier today FINDINGS: Uterus Measurements: 6.9 x 3.6 x 4.0 cm = volume: 52 mL. No fibroids or other mass visualized. Endometrium Thickness: Normal thickness, 4 mm.  No focal abnormality visualized. Right ovary Measurements: 1.4 x 1.7 x 1.2 cm = volume: 1.5 mL. Normal appearance/no adnexal mass. Left ovary Measurements: Not visualized. No adnexal mass seen transabdominally. Pulsed Doppler evaluation of the right ovary demonstrates normal low-resistance arterial and venous waveforms. Other findings No abnormal free fluid. The study was terminated early due to patient vomiting. IMPRESSION: No acute findings seen. Left ovary not visualized due to termination of the study early due to patient vomiting. Electronically Signed   By: Rolm Baptise M.D.   On: 01/10/2021 11:20        Scheduled Meds:  metoprolol tartrate  5 mg Intravenous Q8H   pantoprazole (PROTONIX) IV  40 mg Intravenous Daily   Continuous Infusions:   LOS: 0 days   The patient is critically ill with multiple organ systems failure and requires high complexity decision making for assessment and support, frequent evaluation and titration of therapies, application of advanced monitoring technologies and extensive interpretation of multiple databases. Critical Care Time devoted to patient care services described in this note  Time spent: 40 minutes     Vontrell Pullman, Geraldo Docker, MD [facility]   If 7PM-7AM, please contact night-coverage 01/11/2021, 7:53 AM

## 2021-01-12 DIAGNOSIS — K760 Fatty (change of) liver, not elsewhere classified: Secondary | ICD-10-CM

## 2021-01-12 DIAGNOSIS — R5383 Other fatigue: Secondary | ICD-10-CM

## 2021-01-12 DIAGNOSIS — R251 Tremor, unspecified: Secondary | ICD-10-CM

## 2021-01-12 DIAGNOSIS — N83209 Unspecified ovarian cyst, unspecified side: Secondary | ICD-10-CM

## 2021-01-12 DIAGNOSIS — D649 Anemia, unspecified: Secondary | ICD-10-CM

## 2021-01-12 DIAGNOSIS — E1165 Type 2 diabetes mellitus with hyperglycemia: Secondary | ICD-10-CM

## 2021-01-12 DIAGNOSIS — E785 Hyperlipidemia, unspecified: Secondary | ICD-10-CM

## 2021-01-12 DIAGNOSIS — Z28310 Unvaccinated for covid-19: Secondary | ICD-10-CM

## 2021-01-12 DIAGNOSIS — E876 Hypokalemia: Secondary | ICD-10-CM

## 2021-01-12 DIAGNOSIS — E669 Obesity, unspecified: Secondary | ICD-10-CM

## 2021-01-12 DIAGNOSIS — Z6836 Body mass index (BMI) 36.0-36.9, adult: Secondary | ICD-10-CM

## 2021-01-12 DIAGNOSIS — I10 Essential (primary) hypertension: Secondary | ICD-10-CM

## 2021-01-12 DIAGNOSIS — E119 Type 2 diabetes mellitus without complications: Secondary | ICD-10-CM

## 2021-01-12 LAB — CBC WITH DIFFERENTIAL/PLATELET
Abs Immature Granulocytes: 0.1 10*3/uL — ABNORMAL HIGH (ref 0.00–0.07)
Basophils Absolute: 0 10*3/uL (ref 0.0–0.1)
Basophils Relative: 0 %
Eosinophils Absolute: 0 10*3/uL (ref 0.0–0.5)
Eosinophils Relative: 0 %
HCT: 35.3 % — ABNORMAL LOW (ref 36.0–46.0)
Hemoglobin: 11.5 g/dL — ABNORMAL LOW (ref 12.0–15.0)
Immature Granulocytes: 1 %
Lymphocytes Relative: 26 %
Lymphs Abs: 2.6 10*3/uL (ref 0.7–4.0)
MCH: 24.1 pg — ABNORMAL LOW (ref 26.0–34.0)
MCHC: 32.6 g/dL (ref 30.0–36.0)
MCV: 73.8 fL — ABNORMAL LOW (ref 80.0–100.0)
Monocytes Absolute: 1 10*3/uL (ref 0.1–1.0)
Monocytes Relative: 10 %
Neutro Abs: 6.1 10*3/uL (ref 1.7–7.7)
Neutrophils Relative %: 63 %
Platelets: 358 10*3/uL (ref 150–400)
RBC: 4.78 MIL/uL (ref 3.87–5.11)
RDW: 18 % — ABNORMAL HIGH (ref 11.5–15.5)
WBC: 9.8 10*3/uL (ref 4.0–10.5)
nRBC: 0 % (ref 0.0–0.2)

## 2021-01-12 LAB — IRON AND TIBC
Iron: 68 ug/dL (ref 28–170)
Saturation Ratios: 18 % (ref 10.4–31.8)
TIBC: 381 ug/dL (ref 250–450)
UIBC: 313 ug/dL

## 2021-01-12 LAB — RETICULOCYTES
Immature Retic Fract: 13.2 % (ref 2.3–15.9)
RBC.: 4.77 MIL/uL (ref 3.87–5.11)
Retic Count, Absolute: 61.1 10*3/uL (ref 19.0–186.0)
Retic Ct Pct: 1.3 % (ref 0.4–3.1)

## 2021-01-12 LAB — VITAMIN B12: Vitamin B-12: 322 pg/mL (ref 180–914)

## 2021-01-12 LAB — COMPREHENSIVE METABOLIC PANEL
ALT: 30 U/L (ref 0–44)
AST: 24 U/L (ref 15–41)
Albumin: 3.7 g/dL (ref 3.5–5.0)
Alkaline Phosphatase: 77 U/L (ref 38–126)
Anion gap: 10 (ref 5–15)
BUN: 6 mg/dL (ref 6–20)
CO2: 26 mmol/L (ref 22–32)
Calcium: 9.3 mg/dL (ref 8.9–10.3)
Chloride: 97 mmol/L — ABNORMAL LOW (ref 98–111)
Creatinine, Ser: 0.49 mg/dL (ref 0.44–1.00)
GFR, Estimated: >60 mL/min (ref >60)
Glucose, Bld: 141 mg/dL — ABNORMAL HIGH (ref 70–99)
Potassium: 3.2 mmol/L — ABNORMAL LOW (ref 3.5–5.1)
Sodium: 133 mmol/L — ABNORMAL LOW (ref 135–145)
Total Bilirubin: 0.9 mg/dL (ref 0.3–1.2)
Total Protein: 8 g/dL (ref 6.5–8.1)

## 2021-01-12 LAB — FERRITIN: Ferritin: 23 ng/mL (ref 11–307)

## 2021-01-12 LAB — FOLATE: Folate: 5.7 ng/mL — ABNORMAL LOW (ref >5.9)

## 2021-01-12 LAB — PHOSPHORUS: Phosphorus: 2.5 mg/dL (ref 2.5–4.6)

## 2021-01-12 LAB — HEMOGLOBIN A1C
Hgb A1c MFr Bld: 6.5 % — ABNORMAL HIGH (ref 4.8–5.6)
Mean Plasma Glucose: 139.85 mg/dL

## 2021-01-12 LAB — MAGNESIUM: Magnesium: 2.1 mg/dL (ref 1.7–2.4)

## 2021-01-12 MED ORDER — METOCLOPRAMIDE HCL 5 MG/ML IJ SOLN
10.0000 mg | Freq: Four times a day (QID) | INTRAMUSCULAR | Status: DC
  Administered 2021-01-12: 14:00:00 10 mg via INTRAVENOUS
  Filled 2021-01-12 (×2): qty 2

## 2021-01-12 MED ORDER — POTASSIUM CHLORIDE CRYS ER 20 MEQ PO TBCR
40.0000 meq | EXTENDED_RELEASE_TABLET | Freq: Once | ORAL | Status: AC
  Administered 2021-01-12: 40 meq via ORAL
  Filled 2021-01-12: qty 2

## 2021-01-12 MED ORDER — OXYCODONE HCL 5 MG PO TABS
5.0000 mg | ORAL_TABLET | Freq: Once | ORAL | Status: AC | PRN
  Administered 2021-01-12: 5 mg via ORAL
  Filled 2021-01-12: qty 1

## 2021-01-12 MED ORDER — POTASSIUM CHLORIDE 10 MEQ/100ML IV SOLN
10.0000 meq | INTRAVENOUS | Status: DC
  Administered 2021-01-12 (×2): 10 meq via INTRAVENOUS
  Filled 2021-01-12 (×2): qty 100

## 2021-01-12 MED ORDER — METOCLOPRAMIDE HCL 5 MG/ML IJ SOLN
5.0000 mg | Freq: Four times a day (QID) | INTRAMUSCULAR | Status: DC
Start: 2021-01-12 — End: 2021-01-12

## 2021-01-12 MED ORDER — PROCHLORPERAZINE EDISYLATE 10 MG/2ML IJ SOLN
5.0000 mg | Freq: Once | INTRAMUSCULAR | Status: AC | PRN
  Administered 2021-01-12: 02:00:00 5 mg via INTRAVENOUS
  Filled 2021-01-12: qty 2

## 2021-01-12 MED ORDER — DICLOFENAC SODIUM 1 % EX GEL
2.0000 g | CUTANEOUS | Status: DC | PRN
  Administered 2021-01-12: 22:00:00 2 g via TOPICAL
  Filled 2021-01-12: qty 100

## 2021-01-12 MED ORDER — ATORVASTATIN CALCIUM 40 MG PO TABS
40.0000 mg | ORAL_TABLET | Freq: Every day | ORAL | Status: DC
  Administered 2021-01-12: 12:00:00 40 mg via ORAL
  Filled 2021-01-12 (×2): qty 1

## 2021-01-12 NOTE — Progress Notes (Signed)
PROGRESS NOTE    Regina Robertson  DGU:[id number] DOB: 27-Mar-1980 DOA: 01/10/2021 PCP: Patient, No Pcp Per (Inactive)   Brief Narrative:   40 y.o. BF PMHx Anxiety, bilateral chronic knee pain, thyroid disease, class II obesity   Admitted with abdominal pain, nausea vomiting and diarrhea since yesterday after she was given an unknown medication for knee pain by a friend.  She has vomited multiple times and has several episodes of loose stool.  No melena or hematochezia.  No flank pain, dysuria, frequency or hematuria.  No fever, chills, rhinorrhea, sore throat, dyspnea, chest pain, palpitations, dizziness, diaphoresis, PND, orthopnea or pitting edema of the lower extremity.  No polyuria, polydipsia, polyphagia or blurred vision.  She stated she is no longer nauseous and would like to try something to eat.  I agree to try clear liquids this evening.  N.p.o. after midnight pending surgical reevaluation tomorrow morning.   ED Course: Initial vital signs were temperature 98.2 F, pulse 70, respirations 17, BP 167/111 and O2 sat 95% on room air.  The patient received 1000 mL of LR bolus, metoclopramide 10 mg IVP, hydromorphone 1 mg IVP, morphine 4 mg IVP, ondansetron 4 mg IVP and 1000 mL of NS bolus.   Lab work: Her urinalysis showed increase of specific gravity, small hemoglobinuria, ketonuria of 88 proteinuria 30 mg/dL.  Coronavirus and influenza PCR normal.  CBC showed a white count of 13.6, hemoglobin 11.9 g/dL platelets 956.  Lipase was normal.  CMP showed chloride of 97 mmol/L, the rest of the electrolytes were normal.  Glucose 177 and total protein 8.8 g/dL.  The rest of the CMP values were normal.   Imaging: CT abdomen/pelvis with contrast did not show any definite acute findings or explanation for the patient's symptoms.  There was severe hepatic asteatosis.  Transvaginal ultrasound and abdominal pelvic Doppler did not show any acute findings.  Please see images and full radiology report for  further details.   Subjective: 12/22 afebrile overnight patient lying in bed stating she does not feel well.  Positive nausea/vomiting.  Refused to get out of bed to the chair.   Assessment & Plan:  Covid vaccination; unvaccinated  Principal Problem:   Right lower quadrant abdominal pain Active Problems:   Tremor   Microcytic anemia   Hyperglycemia   Ruptured ovarian cyst   New onset type 2 diabetes mellitus (HCC)  Right lower quadrant abdominal pain -N.p.o. - 12/21 CT abdomen pelvis pending  -12/21 surgery consulted: Reviewed CT abdomen no role for general surgery.  We will sign off.   -12/22 CT abdomen showed severe hepatic steatosis -12/22 discontinue IV narcotics, most likely worsening patient's nausea.  Abdomen benign on exam  Severe hepatic steatosis - Most likely secondary to patient's obesity vs EtOH vs DM. - Patient states she does not drink.  Counseled given her liver function should refrain from EtOH. - DM type II.?  12/21 hemoglobin A1c= 6.6.,  If repeat hemoglobin A1c> 6.6 will meet criteria for new onset DM type II.  We will start treatment at that time -12/22 DM gastroparesis?  Reglan 5 mg QID  Ruptured ovarian cyst - 12/22 CT scan  Tremor - Hold propranolol given patient's heart rate.  Essential HTN -12/21 Hydralazine 5 mg QID -12/22 increase Metoprolol IV 10 mg QID   Anemia unspecified - 12/21 anemia panel pending    Newly diagnosed DM type II with Hyperglycemia -12/21 hemoglobin A1c= 6.6, this would indicate patient has diabetes - 12/22 repeat hemoglobin A1c= 6.5 confirms  patient with diabetes and begin treatment.   -12/22 consult DM coordinator -12/22 consult nutrition  HLD - 12/21 LDL= 130.  Goal<70 - 12/22 Lipitor 40 mg daily   Obese (BMI 36.26kg/m) -  Hypokalemia - Potassium goal> 4 - 12/22 Potassium IV 60 mEq  Lethargic - 12/21 urine tox screen negative for everything except opiates which she is obtaining here  Goals of  care - 12/21 consult LCSW: Needs to establish with a PCP.    DVT prophylaxis: SCD Code Status: Full Family Communication:  Status is: Inpatient    Dispo: The patient is from: Home              Anticipated d/c is to: Home              Anticipated d/c date is: 2 days              Patient currently is not medically stable to d/c.      Consultants:  Surgery  Procedures/Significant Events:  12/22 CT abdomen pelvis W contrast - Normal appendix -Interval increased small to moderate amount of free fluid in the right adnexa and posterior cul-de-sac, may represent recently ruptured cyst and possible explanation for pain - Hepatomegaly and hepatic steatosis - Mildly dilated common bile duct 12/20 US transvaginal 12/20 US pelvic Doppler 12/20 US pelvis transabdominal 12/20 CT abdomen pelvis W contrast -Negative acute findings to explain patient's symptoms. - Severe hepatic steatosis   I have personally reviewed and interpreted all radiology studies and my findings are as above.  VENTILATOR SETTINGS:    Cultures   Antimicrobials: Anti-infectives (From admission, onward)    None         Devices    LINES / TUBES:      Continuous Infusions:  dextrose 5 % and 0.45% NaCl 100 mL/hr at 01/11/21 0931   potassium chloride       Objective: Vitals:   01/11/21 1322 01/11/21 2101 01/12/21 0636 01/12/21 1519  BP: (!) 159/91 (!) 146/81 (!) 154/85 (!) 153/81  Pulse: (!) 59 88 95 66  Resp: 18 17 18 16   Temp: 98.7 F (37.1 C) 98.4 F (36.9 C) 98.4 F (36.9 C) 98.6 F (37 C)  TempSrc: Oral Oral Oral Oral  SpO2: 98% 99% 99% 99%  Weight:      Height:        Intake/Output Summary (Last 24 hours) at 01/12/2021 2010 Last data filed at 01/12/2021 1746 Gross per 24 hour  Intake 480 ml  Output 1800 ml  Net -1320 ml   Filed Weights   01/10/21 0616  Weight: 117.9 kg   Physical Exam:  General: A/O x4, No acute respiratory distress Eyes: negative scleral  hemorrhage, negative anisocoria, negative icterus ENT: Negative Runny nose, negative gingival bleeding, Neck:  Negative scars, masses, torticollis, lymphadenopathy, JVD Lungs: Clear to auscultation bilaterally without wheezes or crackles Cardiovascular: Regular rate and rhythm without murmur gallop or rub normal S1 and S2 Abdomen: OBESE, negative abdominal pain, nondistended, positive soft, bowel sounds, no rebound, no ascites, no appreciable mass Extremities: No significant cyanosis, clubbing, or edema bilateral lower extremities Skin: Negative rashes, lesions, ulcers Psychiatric:  Negative depression, negative anxiety, negative fatigue, negative mania  Central nervous system:  Cranial nerves II through XII intact, tongue/uvula midline, all extremities muscle strength 5/5, sensation intact throughout, negative dysarthria, negative expressive aphasia, negative receptive aphasia.   .     Data Reviewed: Care during the described time interval was provided by me .  I  have reviewed this patient's available data, including medical history, events of note, physical examination, and all test results as part of my evaluation.   CBC: Recent Labs  Lab 01/10/21 0613 01/11/21 0438 01/12/21 0417  WBC 13.6* 13.0* 9.8  NEUTROABS  --   --  6.1  HGB 11.9* 11.8* 11.5*  HCT 38.7 36.6 35.3*  MCV 76.3* 75.6* 73.8*  PLT 409* 380 [id number]   Basic Metabolic Panel: Recent Labs  Lab 01/10/21 0613 01/11/21 0438 01/12/21 0417  NA 136 134* 133*  K 3.8 3.2* 3.2*  CL 97* 95* 97*  CO2 25 27 26   GLUCOSE 177* 118* 141*  BUN 12 8 6   CREATININE 0.77 0.64 0.49  CALCIUM 9.6 9.3 9.3  MG  --   --  2.1  PHOS  --   --  2.5   GFR: Estimated Creatinine Clearance: 129.6 mL/min (by C-G formula based on SCr of 0.49 mg/dL). Liver Function Tests: Recent Labs  Lab 01/10/21 [id number] 01/11/21 0438 01/12/21 0417  AST 35 23 24  ALT 44 34 30  ALKPHOS 98 86 77  BILITOT 0.8 1.0 0.9  PROT 8.8* 8.6* 8.0  ALBUMIN 4.1 3.9 3.7    Recent Labs  Lab 01/10/21 0613  LIPASE 37   No results for input(s): AMMONIA in the last 168 hours. Coagulation Profile: No results for input(s): INR, PROTIME in the last 168 hours. Cardiac Enzymes: No results for input(s): CKTOTAL, CKMB, CKMBINDEX, TROPONINI in the last 168 hours. BNP (last 3 results) No results for input(s): PROBNP in the last 8760 hours. HbA1C: Recent Labs    01/11/21 0438 01/12/21 1322  HGBA1C 6.6* 6.5*   CBG: No results for input(s): GLUCAP in the last 168 hours. Lipid Profile: Recent Labs    01/11/21 0438  CHOL 166  HDL 25*  LDLCALC 130*  TRIG 55  CHOLHDL 6.6   Thyroid Function Tests: No results for input(s): TSH, T4TOTAL, FREET4, T3FREE, THYROIDAB in the last 72 hours. Anemia Panel: Recent Labs    01/12/21 0417  VITAMINB12 322  FOLATE 5.7*  FERRITIN 23  TIBC 381  IRON 68  RETICCTPCT 1.3   Urine analysis:    Component Value Date/Time   COLORURINE YELLOW 01/10/2021 1533   APPEARANCEUR CLEAR 01/10/2021 1533   LABSPEC >1.046 (H) 01/10/2021 1533   PHURINE 6.0 01/10/2021 1533   GLUCOSEU NEGATIVE 01/10/2021 1533   HGBUR SMALL (A) 01/10/2021 1533   BILIRUBINUR NEGATIVE 01/10/2021 1533   KETONESUR 80 (A) 01/10/2021 1533   PROTEINUR 30 (A) 01/10/2021 1533   NITRITE NEGATIVE 01/10/2021 1533   LEUKOCYTESUR NEGATIVE 01/10/2021 1533   Sepsis Labs: @LABRCNTIP (procalcitonin:4,lacticidven:4)  ) Recent Results (from the past 240 hour(s))  Resp Panel by RT-PCR (Flu A&B, Covid) Nasopharyngeal Swab     Status: None   Collection Time: 01/10/21 11:59 AM   Specimen: Nasopharyngeal Swab; Nasopharyngeal(NP) swabs in vial transport medium  Result Value Ref Range Status   SARS Coronavirus 2 by RT PCR NEGATIVE NEGATIVE Final    Comment: (NOTE) SARS-CoV-2 target nucleic acids are NOT DETECTED.  The SARS-CoV-2 RNA is generally detectable in upper respiratory specimens during the acute phase of infection. The lowest concentration of SARS-CoV-2  viral copies this assay can detect is 138 copies/mL. A negative result does not preclude SARS-Cov-2 infection and should not be used as the sole basis for treatment or other patient management decisions. A negative result may occur with  improper specimen collection/handling, submission of specimen other than nasopharyngeal swab, presence of viral mutation(s)  within the areas targeted by this assay, and inadequate number of viral copies(<138 copies/mL). A negative result must be combined with clinical observations, patient history, and epidemiological information. The expected result is Negative.  Fact Sheet for Patients:  EntrepreneurPulse.com.au  Fact Sheet for Healthcare Providers:  IncredibleEmployment.be  This test is no t yet approved or cleared by the [place] FDA and  has been authorized for detection and/or diagnosis of SARS-CoV-2 by FDA under an Emergency Use Authorization (EUA). This EUA will remain  in effect (meaning this test can be used) for the duration of the COVID-19 declaration under Section 564(b)(1) of the Act, 21 U.S.C.section 360bbb-3(b)(1), unless the authorization is terminated  or revoked sooner.       Influenza A by PCR NEGATIVE NEGATIVE Final   Influenza B by PCR NEGATIVE NEGATIVE Final    Comment: (NOTE) The Xpert Xpress SARS-CoV-2/FLU/RSV plus assay is intended as an aid in the diagnosis of influenza from Nasopharyngeal swab specimens and should not be used as a sole basis for treatment. Nasal washings and aspirates are unacceptable for Xpert Xpress SARS-CoV-2/FLU/RSV testing.  Fact Sheet for Patients: EntrepreneurPulse.com.au  Fact Sheet for Healthcare Providers: IncredibleEmployment.be  This test is not yet approved or cleared by the [place] FDA and has been authorized for detection and/or diagnosis of SARS-CoV-2 by FDA under an Emergency Use Authorization  (EUA). This EUA will remain in effect (meaning this test can be used) for the duration of the COVID-19 declaration under Section 564(b)(1) of the Act, 21 U.S.C. section 360bbb-3(b)(1), unless the authorization is terminated or revoked.  Performed at [facility], [place] [address], [place], [place] [address]          Radiology Studies: CT ABDOMEN PELVIS W CONTRAST  Result Date: 01/11/2021 CLINICAL DATA:  Right lower quadrant pain EXAM: CT ABDOMEN AND PELVIS WITH CONTRAST TECHNIQUE: Multidetector CT imaging of the abdomen and pelvis was performed using the standard protocol following bolus administration of intravenous contrast. CONTRAST:  139mL OMNIPAQUE IOHEXOL 350 MG/ML SOLN COMPARISON:  CT abdomen and pelvis 01/10/2021 FINDINGS: Lower chest: No acute abnormality. Hepatobiliary: Liver is enlarged measuring 22 cm in length with marked hypodense parenchyma consistent with steatosis. Also focal hypodensity adjacent to the falciform ligament likely represents focal fat. Gallbladder is within normal limits. Common bile duct is mildly dilated measuring 7 mm in diameter. Pancreas: Unremarkable. No pancreatic ductal dilatation or surrounding inflammatory changes. Spleen: Normal in size without focal abnormality. Adrenals/Urinary Tract: Adrenal glands are unremarkable. Kidneys are normal, without renal calculi, focal lesion, or hydronephrosis. Bladder is unremarkable. Stomach/Bowel: No bowel obstruction, free air or pneumatosis. No bowel wall edema or thickening identified. No evidence of acute appendicitis. Vascular/Lymphatic: No significant vascular findings are present. No enlarged abdominal or pelvic lymph nodes. Reproductive: Uterus is unremarkable. No suspicious adnexal mass visualized. There is interval increase in small to moderate amount of free fluid in the right adnexa and right posterior cul-de-sac, likely physiologic in nature. Other: No frank ascites. Musculoskeletal: No  acute or significant osseous findings. IMPRESSION: 1. Appendix is normal. 2. Interval increased small to moderate amount of free fluid in the right adnexa and posterior cul-de-sac, may represent recently ruptured cyst and possible explanation for pain. 3. Hepatomegaly and hepatic steatosis. 4. Mildly dilated common bile duct, correlate clinically for evidence of obstruction and consider MRCP if indicated. Electronically Signed   By: Ofilia Neas M.D.   On: 01/11/2021 14:56        Scheduled Meds:  atorvastatin  40 mg Oral Daily   hydrALAZINE  5 mg Intravenous Q6H   metoCLOPramide (REGLAN) injection  10 mg Intravenous Q6H   metoprolol tartrate  5 mg Intravenous Q8H   pantoprazole (PROTONIX) IV  40 mg Intravenous Daily   Continuous Infusions:  dextrose 5 % and 0.45% NaCl 100 mL/hr at 01/11/21 0931   potassium chloride       LOS: 1 day   The patient is critically ill with multiple organ systems failure and requires high complexity decision making for assessment and support, frequent evaluation and titration of therapies, application of advanced monitoring technologies and extensive interpretation of multiple databases. Critical Care Time devoted to patient care services described in this note  Time spent: 40 minutes     Calder Oblinger, Geraldo Docker, MD [facility]   If 7PM-7AM, please contact night-coverage 01/12/2021, 8:10 PM

## 2021-01-12 NOTE — Progress Notes (Signed)
Patient is refusing the rest of her IV Potassium, states her IV is hurting, potassium had been  decreased to 50 mls/hr, accessed IV site, no swelling or redness noted. Still c/o pain 10/10. NP [facility] [employer] notified.

## 2021-01-12 NOTE — Progress Notes (Signed)
Patient was given her pain medicine and nausea medicine, this RN notice her pump to be turned off and when pt was asked she reported " that the last person who came here said that I don't need it that its ok to turned it off" , RN asked who is this person and pt verbalized not remembering who. Pt was then educated about ordered IV fluids but pt refused and wants to be saline lock.  We will continue to monitor.

## 2021-01-12 NOTE — Progress Notes (Signed)
NP Regina Robertson was paged because patient is complaining of chronic pain in left, 10/10. Refuses to take PRN Tylenol because she says its not going to do anything.

## 2021-01-13 ENCOUNTER — Other Ambulatory Visit (HOSPITAL_COMMUNITY): Payer: Self-pay

## 2021-01-13 DIAGNOSIS — Z91199 Patient's noncompliance with other medical treatment and regimen due to unspecified reason: Secondary | ICD-10-CM

## 2021-01-13 DIAGNOSIS — D509 Iron deficiency anemia, unspecified: Secondary | ICD-10-CM

## 2021-01-13 LAB — PHOSPHORUS: Phosphorus: 3.2 mg/dL (ref 2.5–4.6)

## 2021-01-13 LAB — COMPREHENSIVE METABOLIC PANEL
ALT: 32 U/L (ref 0–44)
AST: 22 U/L (ref 15–41)
Albumin: 3.8 g/dL (ref 3.5–5.0)
Alkaline Phosphatase: 80 U/L (ref 38–126)
Anion gap: 10 (ref 5–15)
BUN: 7 mg/dL (ref 6–20)
CO2: 25 mmol/L (ref 22–32)
Calcium: 9.3 mg/dL (ref 8.9–10.3)
Chloride: 97 mmol/L — ABNORMAL LOW (ref 98–111)
Creatinine, Ser: 0.71 mg/dL (ref 0.44–1.00)
GFR, Estimated: >60 mL/min (ref >60)
Glucose, Bld: 128 mg/dL — ABNORMAL HIGH (ref 70–99)
Potassium: 3.3 mmol/L — ABNORMAL LOW (ref 3.5–5.1)
Sodium: 132 mmol/L — ABNORMAL LOW (ref 135–145)
Total Bilirubin: 1 mg/dL (ref 0.3–1.2)
Total Protein: 8.1 g/dL (ref 6.5–8.1)

## 2021-01-13 LAB — CBC WITH DIFFERENTIAL/PLATELET
Abs Immature Granulocytes: 0.03 10*3/uL (ref 0.00–0.07)
Basophils Absolute: 0 10*3/uL (ref 0.0–0.1)
Basophils Relative: 0 %
Eosinophils Absolute: 0 10*3/uL (ref 0.0–0.5)
Eosinophils Relative: 0 %
HCT: 37.8 % (ref 36.0–46.0)
Hemoglobin: 12.2 g/dL (ref 12.0–15.0)
Immature Granulocytes: 0 %
Lymphocytes Relative: 34 %
Lymphs Abs: 3.4 10*3/uL (ref 0.7–4.0)
MCH: 24.1 pg — ABNORMAL LOW (ref 26.0–34.0)
MCHC: 32.3 g/dL (ref 30.0–36.0)
MCV: 74.6 fL — ABNORMAL LOW (ref 80.0–100.0)
Monocytes Absolute: 1.1 10*3/uL — ABNORMAL HIGH (ref 0.1–1.0)
Monocytes Relative: 11 %
Neutro Abs: 5.3 10*3/uL (ref 1.7–7.7)
Neutrophils Relative %: 55 %
Platelets: 409 10*3/uL — ABNORMAL HIGH (ref 150–400)
RBC: 5.07 MIL/uL (ref 3.87–5.11)
RDW: 18 % — ABNORMAL HIGH (ref 11.5–15.5)
WBC: 9.9 10*3/uL (ref 4.0–10.5)
nRBC: 0 % (ref 0.0–0.2)

## 2021-01-13 LAB — MAGNESIUM: Magnesium: 2 mg/dL (ref 1.7–2.4)

## 2021-01-13 MED ORDER — ATORVASTATIN CALCIUM 40 MG PO TABS
40.0000 mg | ORAL_TABLET | Freq: Every day | ORAL | 0 refills | Status: AC
  Filled 2021-01-13: qty 30, 30d supply, fill #0

## 2021-01-13 MED ORDER — METOCLOPRAMIDE HCL 10 MG PO TABS
10.0000 mg | ORAL_TABLET | Freq: Three times a day (TID) | ORAL | 0 refills | Status: AC
  Filled 2021-01-13: qty 90, 30d supply, fill #0

## 2021-01-13 MED ORDER — PROPRANOLOL HCL ER 60 MG PO CP24
60.0000 mg | ORAL_CAPSULE | Freq: Every day | ORAL | Status: DC
  Administered 2021-01-13: 14:00:00 60 mg via ORAL
  Filled 2021-01-13: qty 1

## 2021-01-13 MED ORDER — DICLOFENAC SODIUM 1 % EX GEL
2.0000 g | CUTANEOUS | 0 refills | Status: AC | PRN
Start: 2021-01-13 — End: ?
  Filled 2021-01-13: qty 100, 25d supply, fill #0

## 2021-01-13 MED ORDER — ONDANSETRON HCL 4 MG PO TABS
4.0000 mg | ORAL_TABLET | Freq: Four times a day (QID) | ORAL | 0 refills | Status: AC | PRN
  Filled 2021-01-13: qty 20, 5d supply, fill #0

## 2021-01-13 MED ORDER — LIVING WELL WITH DIABETES BOOK
Freq: Once | Status: AC
  Filled 2021-01-13: qty 1

## 2021-01-13 NOTE — TOC Initial Note (Signed)
Transition of Care [facility]) - Initial/Assessment Note    Patient Details  Name: Regina Robertson MRN: [id number] Date of Birth: Mar 18, 1980  Transition of Care (TOC) CM/SW Contact:    Armanda Heritage, RN Phone Number: 01/13/2021, 2:35 PM  Clinical Narrative:                 CM notes TOC consult for pcp needs.  CM spoke with patient who confirms she does not currently have a pcp and verifies that she has [employer].  Appointment set with Gwinda Passe at [facility] family medicine for February 10, 2021.  Have requested that clinical contact patient if an earlier appointment time opens.  Information placed on AVS and patient instructed to contact clinic if unable to attend appointment.  Expected Discharge Plan: Home/Self Care Barriers to Discharge: No Barriers Identified   Patient Goals and CMS Choice Patient states their goals for this hospitalization and ongoing recovery are:: to go home      Expected Discharge Plan and Services Expected Discharge Plan: Home/Self Care   Discharge Planning Services: CM Consult   Living arrangements for the past 2 months: Single Family Home Expected Discharge Date: 01/13/21                                    Prior Living Arrangements/Services Living arrangements for the past 2 months: Single Family Home   Patient language and need for interpreter reviewed:: Yes Do you feel safe going back to the place where you live?: Yes      Need for Family Participation in Patient Care: Yes (Comment) Care giver support system in place?: Yes (comment)   Criminal Activity/Legal Involvement Pertinent to Current Situation/Hospitalization: No - Comment as needed  Activities of Daily Living Home Assistive Devices/Equipment: Cane (specify quad or straight) (quad) ADL Screening (condition at time of admission) Patient's cognitive ability adequate to safely complete daily activities?: Yes Is the patient deaf or have difficulty hearing?:  No Does the patient have difficulty seeing, even when wearing glasses/contacts?: No Does the patient have difficulty concentrating, remembering, or making decisions?: No Patient able to express need for assistance with ADLs?: Yes Does the patient have difficulty dressing or bathing?: Yes Independently performs ADLs?: Yes (appropriate for developmental age) Does the patient have difficulty walking or climbing stairs?: Yes Weakness of Legs: Right Weakness of Arms/Hands: None  Permission Sought/Granted                  Emotional Assessment Appearance:: Appears stated age     Orientation: : Oriented to Self, Oriented to Place, Oriented to  Time, Oriented to Situation   Psych Involvement: No (comment)  Admission diagnosis:  Right lower quadrant abdominal pain [R10.31] Patient Active Problem List   Diagnosis Date Noted   Ruptured ovarian cyst 01/12/2021   New onset type 2 diabetes mellitus (HCC) 01/12/2021   Right lower quadrant abdominal pain 01/10/2021   Hyperglycemia 01/10/2021   Tremor 05/18/2013   Microcytic anemia 05/18/2013   Knee pain 05/18/2013   Tremors of nervous system 05/18/2013   PCP:  Patient, No Pcp Per (Inactive) Pharmacy:   [facility] [phone] - [place], [place] - [phone] [facility] [address] [place] [address] Phone: [phone] Fax: [phone]  [facility] [address] [place] [address] Phone: [phone] Fax: [phone]     Social Determinants of Health (SDOH)  Interventions    Readmission Risk Interventions No flowsheet data found.

## 2021-01-13 NOTE — Discharge Summary (Signed)
Physician Discharge Summary  Regina Robertson [id number] DOB: Mar 27, 1980 DOA: 01/10/2021  PCP: Patient, No Pcp Per (Inactive)  Admit date: 01/10/2021 Discharge date: 01/13/2021  Time spent: 35 minutes  Recommendations for Outpatient Follow-up:   Covid vaccination; unvaccinated  Right lower quadrant abdominal pain -N.p.o. - 12/21 CT abdomen pelvis pending  -12/21 surgery consulted: Reviewed CT abdomen no role for general surgery.  We will sign off.   -12/22 CT abdomen showed severe hepatic steatosis -12/22 discontinue IV narcotics, most likely worsening patient's nausea.  Abdomen benign on exam -12/23 patient's exam benign.  Stable for discharge  Severe hepatic steatosis - Most likely secondary to patient's obesity vs EtOH vs DM. - Patient states she does not drink.  Counseled given her liver function should refrain from EtOH. - DM type II.?  12/21 hemoglobin A1c= 6.6.,  If repeat hemoglobin A1c> 6.6 will meet criteria for new onset DM type II.  We will start treatment at that time -12/22 DM gastroparesis?  Reglan 5 mg QID -12/23 discharge Reglan 5 mg QID -Obtain PCP and establish care: Severe hepatic steatosis, new onset DM type II with hyperglycemia.   Ruptured ovarian cyst - 12/22 see CT scan   Tremor - See essential hypertension.   Essential HTN - Restart home medication propranolol 60 mg daily   Mixed Anemia-Microcytic anemia - 12/21 anemia panel consistent with a mixed anemia   Newly diagnosed DM type II with Hyperglycemia -12/21 hemoglobin A1c= 6.6, this would indicate patient has diabetes - 12/22 repeat hemoglobin A1c= 6.5 confirms patient with diabetes and begin treatment.   -12/22 consult DM coordinator -12/22 consult nutrition   HLD - 12/21 LDL= 130.  Goal<70 - 12/22 Lipitor 40 mg daily     Obese (BMI 36.26kg/m) - Counseled about need for weight loss, and how it could be contributing to her hepatic steatosis.   Hypokalemia - Potassium goal> 4 -  12/22 Potassium IV 60 mEq (not completed patient refused)   Lethargic - 12/21 urine tox screen negative for everything except opiates which she is obtaining here   Noncompliance with medication - Since admission patient has refused multiple diagnostic and therapeutic modalities (review RN charting).  This includes actually turning off IV pump, refusing p.o. medication, and refusing IV meds.   Discharge Diagnoses:  Principal Problem:   Right lower quadrant abdominal pain Active Problems:   Tremor   Microcytic anemia   Hyperglycemia   Ruptured ovarian cyst   New onset type 2 diabetes mellitus ([place])   Discharge Condition: Stable  Diet recommendation: Carb modified/heart healthy  Filed Weights   01/10/21 0616  Weight: 117.9 kg    History of present illness:  40 y.o. BF PMHx Anxiety, bilateral chronic knee pain, thyroid disease, class II obesity    Admitted with abdominal pain, nausea vomiting and diarrhea since yesterday after she was given an unknown medication for knee pain by a friend.  She has vomited multiple times and has several episodes of loose stool.  No melena or hematochezia.  No flank pain, dysuria, frequency or hematuria.  No fever, chills, rhinorrhea, sore throat, dyspnea, chest pain, palpitations, dizziness, diaphoresis, PND, orthopnea or pitting edema of the lower extremity.  No polyuria, polydipsia, polyphagia or blurred vision.  She stated she is no longer nauseous and would like to try something to eat.  I agree to try clear liquids this evening.  N.p.o. after midnight pending surgical reevaluation tomorrow morning.   ED Course: Initial vital signs were temperature 98.2 F, pulse 70,  respirations 17, BP 167/111 and O2 sat 95% on room air.  The patient received 1000 mL of LR bolus, metoclopramide 10 mg IVP, hydromorphone 1 mg IVP, morphine 4 mg IVP, ondansetron 4 mg IVP and 1000 mL of NS bolus.   Lab work: Her urinalysis showed increase of specific gravity, small  hemoglobinuria, ketonuria of 88 proteinuria 30 mg/dL.  Coronavirus and influenza PCR normal.  CBC showed a white count of 13.6, hemoglobin 11.9 g/dL platelets 409.  Lipase was normal.  CMP showed chloride of 97 mmol/L, the rest of the electrolytes were normal.  Glucose 177 and total protein 8.8 g/dL.  The rest of the CMP values were normal.   Imaging: CT abdomen/pelvis with contrast did not show any definite acute findings or explanation for the patient's symptoms.  There was severe hepatic asteatosis.  Transvaginal ultrasound and abdominal pelvic Doppler did not show any acute findings.  Please see images and full radiology report for further details.  Hospital Course:  See above   Consultants:  Surgery   Procedures/Significant Events:  12/22 CT abdomen pelvis W contrast - Normal appendix -Interval increased small to moderate amount of free fluid in the right adnexa and posterior cul-de-sac, may represent recently ruptured cyst and possible explanation for pain - Hepatomegaly and hepatic steatosis - Mildly dilated common bile duct 12/20 US transvaginal 12/20 US pelvic Doppler 12/20 US pelvis transabdominal 12/20 CT abdomen pelvis W contrast -Negative acute findings to explain patient's symptoms. - Severe hepatic steatosis    Discharge Exam: Vitals:   01/12/21 1519 01/12/21 2042 01/12/21 2139 01/13/21 0532  BP: (!) 153/81 (!) 167/81 (!) 155/87 (!) 155/94  Pulse: 66 (!) 59 72 76  Resp: 16 16 16 17   Temp: 98.6 F (37 C) 98.5 F (36.9 C) 98.8 F (37.1 C) 98.1 F (36.7 C)  TempSrc: Oral Oral Oral Oral  SpO2: 99% 98% 98% 98%  Weight:      Height:        General: A/O x4, No acute respiratory distress Eyes: negative scleral hemorrhage, negative anisocoria, negative icterus ENT: Negative Runny nose, negative gingival bleeding, Neck:  Negative scars, masses, torticollis, lymphadenopathy, JVD Lungs: Clear to auscultation bilaterally without wheezes or crackles Cardiovascular:  Regular rate and rhythm without murmur gallop or rub normal S1 and S2 Abdomen: OBESE, negative abdominal pain, nondistended, positive soft, bowel sounds, no rebound, no ascites, no appreciable mass  Discharge Instructions   Allergies as of 01/13/2021   No Known Allergies      Medication List     STOP taking these medications    azithromycin 250 MG tablet Commonly known as: Zithromax Z-Pak   cefdinir 300 MG capsule Commonly known as: OMNICEF       TAKE these medications    atorvastatin 40 MG tablet Commonly known as: LIPITOR Take 1 tablet (40 mg total) by mouth daily. Start taking on: January 14, 2021   clonazePAM 1 MG tablet Commonly known as: KLONOPIN Take 1 tablet (1 mg total) by mouth 2 (two) times daily as needed (spasms).   diclofenac Sodium 1 % Gel Commonly known as: VOLTAREN Apply 2 g topically as needed (Knee pain).   metoCLOPramide 10 MG tablet Commonly known as: REGLAN Take 1 tablet (10 mg total) by mouth 3 (three) times daily with meals.   ondansetron 4 MG tablet Commonly known as: ZOFRAN Take 1 tablet (4 mg total) by mouth every 6 (six) hours as needed for nausea.   propranolol ER 60 MG 24 hr capsule  Commonly known as: Inderal LA Take 1 capsule (60 mg total) by mouth daily.       No Known Allergies    The results of significant diagnostics from this hospitalization (including imaging, microbiology, ancillary and laboratory) are listed below for reference.    Significant Diagnostic Studies: US Transvaginal Non-OB  Result Date: 01/10/2021 CLINICAL DATA:  Right lower quadrant pain EXAM: TRANSABDOMINAL AND TRANSVAGINAL ULTRASOUND OF PELVIS DOPPLER ULTRASOUND OF OVARIES TECHNIQUE: Both transabdominal and transvaginal ultrasound examinations of the pelvis were performed. Transabdominal technique was performed for global imaging of the pelvis including uterus, ovaries, adnexal regions, and pelvic cul-de-sac. It was necessary to proceed with  endovaginal exam following the transabdominal exam to visualize the uterus, endometrium, ovaries and adnexa. Color and duplex Doppler ultrasound was utilized to evaluate blood flow to the ovaries. COMPARISON:  CT earlier today FINDINGS: Uterus Measurements: 6.9 x 3.6 x 4.0 cm = volume: 52 mL. No fibroids or other mass visualized. Endometrium Thickness: Normal thickness, 4 mm.  No focal abnormality visualized. Right ovary Measurements: 1.4 x 1.7 x 1.2 cm = volume: 1.5 mL. Normal appearance/no adnexal mass. Left ovary Measurements: Not visualized. No adnexal mass seen transabdominally. Pulsed Doppler evaluation of the right ovary demonstrates normal low-resistance arterial and venous waveforms. Other findings No abnormal free fluid. The study was terminated early due to patient vomiting. IMPRESSION: No acute findings seen. Left ovary not visualized due to termination of the study early due to patient vomiting. Electronically Signed   By: Charlett Nose M.D.   On: 01/10/2021 11:20   US Pelvis Complete  Result Date: 01/10/2021 CLINICAL DATA:  Right lower quadrant pain EXAM: TRANSABDOMINAL AND TRANSVAGINAL ULTRASOUND OF PELVIS DOPPLER ULTRASOUND OF OVARIES TECHNIQUE: Both transabdominal and transvaginal ultrasound examinations of the pelvis were performed. Transabdominal technique was performed for global imaging of the pelvis including uterus, ovaries, adnexal regions, and pelvic cul-de-sac. It was necessary to proceed with endovaginal exam following the transabdominal exam to visualize the uterus, endometrium, ovaries and adnexa. Color and duplex Doppler ultrasound was utilized to evaluate blood flow to the ovaries. COMPARISON:  CT earlier today FINDINGS: Uterus Measurements: 6.9 x 3.6 x 4.0 cm = volume: 52 mL. No fibroids or other mass visualized. Endometrium Thickness: Normal thickness, 4 mm.  No focal abnormality visualized. Right ovary Measurements: 1.4 x 1.7 x 1.2 cm = volume: 1.5 mL. Normal appearance/no  adnexal mass. Left ovary Measurements: Not visualized. No adnexal mass seen transabdominally. Pulsed Doppler evaluation of the right ovary demonstrates normal low-resistance arterial and venous waveforms. Other findings No abnormal free fluid. The study was terminated early due to patient vomiting. IMPRESSION: No acute findings seen. Left ovary not visualized due to termination of the study early due to patient vomiting. Electronically Signed   By: Charlett Nose M.D.   On: 01/10/2021 11:20   CT ABDOMEN PELVIS W CONTRAST  Result Date: 01/11/2021 CLINICAL DATA:  Right lower quadrant pain EXAM: CT ABDOMEN AND PELVIS WITH CONTRAST TECHNIQUE: Multidetector CT imaging of the abdomen and pelvis was performed using the standard protocol following bolus administration of intravenous contrast. CONTRAST:  OMNIPAQUE IOHEXOL 350 MG/ML SOLN COMPARISON:  CT abdomen and pelvis 01/10/2021 FINDINGS: Lower chest: No acute abnormality. Hepatobiliary: Liver is enlarged measuring 22 cm in length with marked hypodense parenchyma consistent with steatosis. Also focal hypodensity adjacent to the falciform ligament likely represents focal fat. Gallbladder is within normal limits. Common bile duct is mildly dilated measuring 7 mm in diameter. Pancreas: Unremarkable. No pancreatic ductal dilatation  or surrounding inflammatory changes. Spleen: Normal in size without focal abnormality. Adrenals/Urinary Tract: Adrenal glands are unremarkable. Kidneys are normal, without renal calculi, focal lesion, or hydronephrosis. Bladder is unremarkable. Stomach/Bowel: No bowel obstruction, free air or pneumatosis. No bowel wall edema or thickening identified. No evidence of acute appendicitis. Vascular/Lymphatic: No significant vascular findings are present. No enlarged abdominal or pelvic lymph nodes. Reproductive: Uterus is unremarkable. No suspicious adnexal mass visualized. There is interval increase in small to moderate amount of free fluid in  the right adnexa and right posterior cul-de-sac, likely physiologic in nature. Other: No frank ascites. Musculoskeletal: No acute or significant osseous findings. IMPRESSION: 1. Appendix is normal. 2. Interval increased small to moderate amount of free fluid in the right adnexa and posterior cul-de-sac, may represent recently ruptured cyst and possible explanation for pain. 3. Hepatomegaly and hepatic steatosis. 4. Mildly dilated common bile duct, correlate clinically for evidence of obstruction and consider MRCP if indicated. Electronically Signed   By: Ofilia Neas M.D.   On: 01/11/2021 14:56   CT ABDOMEN PELVIS W CONTRAST  Result Date: 01/10/2021 CLINICAL DATA:  Right lower quadrant abdominal pain with nausea and vomiting after recent medication ingestion. Mild leukocytosis. EXAM: CT ABDOMEN AND PELVIS WITH CONTRAST TECHNIQUE: Multidetector CT imaging of the abdomen and pelvis was performed using the standard protocol following bolus administration of intravenous contrast. CONTRAST:  4mL OMNIPAQUE IOHEXOL 350 MG/ML SOLN COMPARISON:  None. FINDINGS: Lower chest: Clear lung bases. No significant pleural or pericardial effusion. Hepatobiliary: There is severe hepatic steatosis with focal fat adjacent to the falciform ligament. No suspicious focal hepatic abnormality or abnormal enhancement identified. No evidence of gallstones, gallbladder wall thickening or biliary dilatation. Pancreas: Unremarkable. No pancreatic ductal dilatation or surrounding inflammatory changes. Spleen: Normal in size without focal abnormality. Adrenals/Urinary Tract: Both adrenal glands appear normal. The kidneys appear normal without evidence of urinary tract calculus, suspicious lesion or hydronephrosis. No bladder abnormalities are seen. Stomach/Bowel: No enteric contrast was administered. The stomach appears unremarkable for its degree of distension. No evidence of bowel wall thickening, distention or surrounding inflammatory  change. The appendix is not definitely seen, although no definite pericecal inflammatory changes to suggest appendicitis. Vascular/Lymphatic: There are no enlarged abdominal or pelvic lymph nodes. No acute vascular findings. Minimal aortic atherosclerosis. The portal, superior mesenteric and splenic veins are patent. Reproductive: The uterus and ovaries appear unremarkable. No evidence of adnexal mass. Other: No evidence of abdominal wall mass or hernia. Trace free pelvic fluid, within physiologic limits. Musculoskeletal: No acute or significant osseous findings. Transitional lumbosacral anatomy. IMPRESSION: 1. No definite acute findings or explanation for the patient's symptoms. Bowel assessment is limited by the lack of enteric contrast, and the appendix is not definitely visualized, although no pericecal inflammatory changes are identified to suggest appendicitis at this time. 2. Severe hepatic steatosis. Electronically Signed   By: Richardean Sale M.D.   On: 01/10/2021 09:13   [place] Art/Ven Flow Abd Pelv Doppler  Result Date: 01/10/2021 CLINICAL DATA:  Right lower quadrant pain EXAM: TRANSABDOMINAL AND TRANSVAGINAL ULTRASOUND OF PELVIS DOPPLER ULTRASOUND OF OVARIES TECHNIQUE: Both transabdominal and transvaginal ultrasound examinations of the pelvis were performed. Transabdominal technique was performed for global imaging of the pelvis including uterus, ovaries, adnexal regions, and pelvic cul-de-sac. It was necessary to proceed with endovaginal exam following the transabdominal exam to visualize the uterus, endometrium, ovaries and adnexa. Color and duplex Doppler ultrasound was utilized to evaluate blood flow to the ovaries. COMPARISON:  CT earlier today FINDINGS: Uterus  Measurements: 6.9 x 3.6 x 4.0 cm = volume: 52 mL. No fibroids or other mass visualized. Endometrium Thickness: Normal thickness, 4 mm.  No focal abnormality visualized. Right ovary Measurements: 1.4 x 1.7 x 1.2 cm = volume: 1.5 mL. Normal  appearance/no adnexal mass. Left ovary Measurements: Not visualized. No adnexal mass seen transabdominally. Pulsed Doppler evaluation of the right ovary demonstrates normal low-resistance arterial and venous waveforms. Other findings No abnormal free fluid. The study was terminated early due to patient vomiting. IMPRESSION: No acute findings seen. Left ovary not visualized due to termination of the study early due to patient vomiting. Electronically Signed   By: Rolm Baptise M.D.   On: 01/10/2021 11:20    Microbiology: Recent Results (from the past 240 hour(s))  Resp Panel by RT-PCR (Flu A&B, Covid) Nasopharyngeal Swab     Status: None   Collection Time: 01/10/21 11:59 AM   Specimen: Nasopharyngeal Swab; Nasopharyngeal(NP) swabs in vial transport medium  Result Value Ref Range Status   SARS Coronavirus 2 by RT PCR NEGATIVE NEGATIVE Final    Comment: (NOTE) SARS-CoV-2 target nucleic acids are NOT DETECTED.  The SARS-CoV-2 RNA is generally detectable in upper respiratory specimens during the acute phase of infection. The lowest concentration of SARS-CoV-2 viral copies this assay can detect is 138 copies/mL. A negative result does not preclude SARS-Cov-2 infection and should not be used as the sole basis for treatment or other patient management decisions. A negative result may occur with  improper specimen collection/handling, submission of specimen other than nasopharyngeal swab, presence of viral mutation(s) within the areas targeted by this assay, and inadequate number of viral copies(<138 copies/mL). A negative result must be combined with clinical observations, patient history, and epidemiological information. The expected result is Negative.  Fact Sheet for Patients:  EntrepreneurPulse.com.au  Fact Sheet for Healthcare Providers:  IncredibleEmployment.be  This test is no t yet approved or cleared by the [place] FDA and  has been authorized  for detection and/or diagnosis of SARS-CoV-2 by FDA under an Emergency Use Authorization (EUA). This EUA will remain  in effect (meaning this test can be used) for the duration of the COVID-19 declaration under Section 564(b)(1) of the Act, 21 U.S.C.section 360bbb-3(b)(1), unless the authorization is terminated  or revoked sooner.       Influenza A by PCR NEGATIVE NEGATIVE Final   Influenza B by PCR NEGATIVE NEGATIVE Final    Comment: (NOTE) The Xpert Xpress SARS-CoV-2/FLU/RSV plus assay is intended as an aid in the diagnosis of influenza from Nasopharyngeal swab specimens and should not be used as a sole basis for treatment. Nasal washings and aspirates are unacceptable for Xpert Xpress SARS-CoV-2/FLU/RSV testing.  Fact Sheet for Patients: EntrepreneurPulse.com.au  Fact Sheet for Healthcare Providers: IncredibleEmployment.be  This test is not yet approved or cleared by the [place] FDA and has been authorized for detection and/or diagnosis of SARS-CoV-2 by FDA under an Emergency Use Authorization (EUA). This EUA will remain in effect (meaning this test can be used) for the duration of the COVID-19 declaration under Section 564(b)(1) of the Act, 21 U.S.C. section 360bbb-3(b)(1), unless the authorization is terminated or revoked.  Performed at [facility], [place] [address] [place], [place] [address]      Labs: Basic Metabolic Panel: Recent Labs  Lab 01/10/21 [phone] 01/11/21 0438 01/12/21 0417 01/13/21 0727  NA 136 134* 133* 132*  K 3.8 3.2* 3.2* 3.3*  CL 97* 95* 97* 97*  CO2 25 27 26  25  GLUCOSE 177* 118* 141* 128*  BUN 12 8 6 7   CREATININE 0.77 0.64 0.49 0.71  CALCIUM 9.6 9.3 9.3 9.3  MG  --   --  2.1 2.0  PHOS  --   --  2.5 3.2   Liver Function Tests: Recent Labs  Lab 01/10/21 0613 01/11/21 0438 01/12/21 0417 01/13/21 0727  AST 35 23 24 22   ALT 44 34 30 32  ALKPHOS 98 86 77 80  BILITOT 0.8 1.0  0.9 1.0  PROT 8.8* 8.6* 8.0 8.1  ALBUMIN 4.1 3.9 3.7 3.8   Recent Labs  Lab 01/10/21 0613  LIPASE 37   No results for input(s): AMMONIA in the last 168 hours. CBC: Recent Labs  Lab 01/10/21 0613 01/11/21 0438 01/12/21 0417 01/13/21 0727  WBC 13.6* 13.0* 9.8 9.9  NEUTROABS  --   --  6.1 5.3  HGB 11.9* 11.8* 11.5* 12.2  HCT 38.7 36.6 35.3* 37.8  MCV 76.3* 75.6* 73.8* 74.6*  PLT 409* 380 358 409*   Cardiac Enzymes: No results for input(s): CKTOTAL, CKMB, CKMBINDEX, TROPONINI in the last 168 hours. BNP: BNP (last 3 results) No results for input(s): BNP in the last 8760 hours.  ProBNP (last 3 results) No results for input(s): PROBNP in the last 8760 hours.  CBG: No results for input(s): GLUCAP in the last 168 hours.     Signed:  Dia Crawford, MD [facility]

## 2021-01-13 NOTE — Progress Notes (Signed)
Inpatient Diabetes Program Recommendations  AACE/ADA: New Consensus Statement on Inpatient Glycemic Control (2015)  Target Ranges:  Prepandial:   less than 140 mg/dL      Peak postprandial:   less than 180 mg/dL (1-2 hours)      Critically ill patients:  140 - 180 mg/dL   Lab Results  Component Value Date   HGBA1C 6.5 (H) 01/12/2021    Review of Glycemic Control  Latest Reference Range & Units 01/11/21 04:38 01/12/21 04:17 01/13/21 07:27  Glucose 70 - 99 mg/dL 465 (H) 035 (H) 465 (H)   Diabetes history: New Diagnosis of DM Outpatient Diabetes medications: None Current orders for Inpatient glycemic control:  None  Inpatient Diabetes Program Recommendations:    Referral received for "new diagnosis of DM"- Note A1C=6.5%.  Will order LWWD booklet for patient. At this point, probably does not need medications added outpatient, but does need very close f/u and possible initiation of oral DM medications outpatient.   Spoke with pt about new diagnosis.  Discussed A1C results with him and explained what an A1C is, basic pathophysiology of DM Type 2, basic home care, importance of checking CBGs and maintaining good CBG control to prevent long-term and short-term complications.    Patient admits to drinking lots of "juices" .  I encouraged her to swap out water for juice in order to reduce sugar intake.  We also briefly discussed CHO sources and the need to reduce CHO in diet.  Patient does not have PCP listed.  Needs plan for f/u and continued f/u for DM.   Thanks,  Beryl Meager, RN, BC-ADM Inpatient Diabetes Coordinator Pager [phone]  (8a-5p)

## 2021-01-13 NOTE — Progress Notes (Signed)
Nurse reviewed discharge instructions with pt.  Pt had flat affect and little response to discharge instructions.  Mediations were delivered to pt from pharmacy prior to discharge.  [employer] transportation services was used to get pt home and pt signed rider waiver.

## 2021-01-13 NOTE — Plan of Care (Signed)
Nutrition Education Note  RD consulted for nutrition education regarding new onset of type 2 diabetes.   Lab Results  Component Value Date   HGBA1C 6.5 (H) 01/12/2021   RD provided "Plate Method for Diabetes" handout from the Academy of Nutrition and Dietetics. Discussed different food groups and their effects on blood sugar, emphasizing carbohydrate-containing foods. Provided list of carbohydrates and recommended serving sizes of common foods.  Discussed importance of controlled and consistent carbohydrate intake throughout the day. Provided examples of ways to balance meals/snacks and encouraged intake of high-fiber, whole grain complex carbohydrates. Teach back method used.  RD to refer to outpatient diabetes management center.  Expect fair compliance.  Body mass index is 36.26 kg/m.  Current diet order is NPO due to vomiting. Labs and medications reviewed.   No further nutrition interventions warranted at this time. RD contact information provided. If additional nutrition issues arise, please re-consult RD.  Vertell Limber, RD, LDN (she/her/hers) Clinical Inpatient Dietitian RD Pager/After-Hours/Weekend Pager # in [place]

## 2021-01-29 ENCOUNTER — Emergency Department (HOSPITAL_COMMUNITY)
Admission: EM | Admit: 2021-01-29 | Discharge: 2021-01-29 | Disposition: A | Discharge status: Home / Self Care | Payer: Medicaid Other | Attending: Student | Admitting: Student

## 2021-01-29 ENCOUNTER — Emergency Department (HOSPITAL_COMMUNITY): Payer: Medicaid Other

## 2021-01-29 ENCOUNTER — Other Ambulatory Visit: Payer: Self-pay

## 2021-01-29 ENCOUNTER — Encounter (HOSPITAL_COMMUNITY): Payer: Self-pay

## 2021-01-29 DIAGNOSIS — S01411A Laceration without foreign body of right cheek and temporomandibular area, initial encounter: Secondary | ICD-10-CM | POA: Diagnosis not present

## 2021-01-29 DIAGNOSIS — W01198A Fall on same level from slipping, tripping and stumbling with subsequent striking against other object, initial encounter: Secondary | ICD-10-CM | POA: Diagnosis not present

## 2021-01-29 DIAGNOSIS — Z23 Encounter for immunization: Secondary | ICD-10-CM | POA: Diagnosis not present

## 2021-01-29 DIAGNOSIS — Z87891 Personal history of nicotine dependence: Secondary | ICD-10-CM | POA: Insufficient documentation

## 2021-01-29 DIAGNOSIS — M542 Cervicalgia: Secondary | ICD-10-CM | POA: Insufficient documentation

## 2021-01-29 DIAGNOSIS — M25511 Pain in right shoulder: Secondary | ICD-10-CM | POA: Insufficient documentation

## 2021-01-29 DIAGNOSIS — W19XXXA Unspecified fall, initial encounter: Secondary | ICD-10-CM

## 2021-01-29 DIAGNOSIS — E119 Type 2 diabetes mellitus without complications: Secondary | ICD-10-CM | POA: Insufficient documentation

## 2021-01-29 DIAGNOSIS — S0181XA Laceration without foreign body of other part of head, initial encounter: Secondary | ICD-10-CM | POA: Insufficient documentation

## 2021-01-29 DIAGNOSIS — S0993XA Unspecified injury of face, initial encounter: Secondary | ICD-10-CM | POA: Diagnosis present

## 2021-01-29 MED ORDER — HYDROCODONE-ACETAMINOPHEN 5-325 MG PO TABS
1.0000 | ORAL_TABLET | Freq: Once | ORAL | Status: AC
  Administered 2021-01-29: 1 via ORAL
  Filled 2021-01-29: qty 1

## 2021-01-29 MED ORDER — LIDOCAINE HCL (PF) 1 % IJ SOLN
5.0000 mL | Freq: Once | INTRAMUSCULAR | Status: AC
  Administered 2021-01-29: 5 mL via INTRADERMAL
  Filled 2021-01-29: qty 30

## 2021-01-29 MED ORDER — TETANUS-DIPHTH-ACELL PERTUSSIS 5-2.5-18.5 LF-MCG/0.5 IM SUSY
0.5000 mL | PREFILLED_SYRINGE | Freq: Once | INTRAMUSCULAR | Status: AC
  Administered 2021-01-29: 0.5 mL via INTRAMUSCULAR
  Filled 2021-01-29: qty 0.5

## 2021-01-29 MED ORDER — LIDOCAINE-EPINEPHRINE-TETRACAINE (LET) TOPICAL GEL
3.0000 mL | Freq: Once | TOPICAL | Status: AC
  Administered 2021-01-29: 3 mL via TOPICAL
  Filled 2021-01-29: qty 3

## 2021-01-29 MED ORDER — HYDROCODONE-ACETAMINOPHEN 5-325 MG PO TABS
1.0000 | ORAL_TABLET | Freq: Once | ORAL | Status: AC
Start: 2021-01-29 — End: 2021-01-29
  Administered 2021-01-29: 1 via ORAL
  Filled 2021-01-29: qty 1

## 2021-01-29 NOTE — ED Triage Notes (Signed)
Pt coming from home via EMS with c/o fall and facial laceration above right lip. Pt fell after standing up and getting light headed and hit face on corner of wall and fell on knees. The laceration is about 2 inches in length. Pt c/o facial pain, right shoulder pain, and pain in both knees. Pt denies any LOC and is not on blood thinners.

## 2021-01-29 NOTE — ED Notes (Signed)
[facility] called for transport.  

## 2021-01-30 ENCOUNTER — Telehealth (HOSPITAL_COMMUNITY): Payer: Self-pay | Admitting: Student

## 2021-01-30 MED ORDER — NAPROXEN 375 MG PO TABS: 375.0000 mg | ORAL_TABLET | Freq: Two times a day (BID) | ORAL | 0 refills | Status: AC

## 2021-01-30 MED ORDER — HYDROCODONE-ACETAMINOPHEN 5-325 MG PO TABS
2.0000 | ORAL_TABLET | ORAL | 0 refills | Status: DC | PRN
Start: 2021-01-30 — End: 2021-02-02

## 2021-01-30 NOTE — Telephone Encounter (Signed)
Note created to call in pain medicine

## 2021-01-30 NOTE — ED Provider Notes (Signed)
[facility] DEPT Provider Note  CSN: [id number] Arrival date & time: 01/29/21 [id number]  Chief Complaint(s) Fall and Facial Laceration  HPI Regina Robertson is a 41 y.o. female PMH T2DM, bilateral chronic knee pain who presents the emergency department for evaluation of a fall facial laceration.  Patient states that she had a mechanical fall prior to arrival and struck her face against the ground.  Patient also endorsing right shoulder and neck pain.  No additional systemic or traumatic complaints   Fall   Past Medical History Past Medical History:  Diagnosis Date   Anxiety    Bilateral chronic knee pain    Thyroid disease    Patient Active Problem List   Diagnosis Date Noted   Ruptured ovarian cyst 01/12/2021   New onset type 2 diabetes mellitus ([place]) 01/12/2021   Right lower quadrant abdominal pain 01/10/2021   Hyperglycemia 01/10/2021   Tremor 05/18/2013   Microcytic anemia 05/18/2013   Knee pain 05/18/2013   Tremors of nervous system 05/18/2013   Home Medication(s) Prior to Admission medications   Medication Sig Start Date End Date Taking? Authorizing Provider  atorvastatin (LIPITOR) 40 MG tablet Take 1 tablet (40 mg total) by mouth daily. 01/14/21  Yes Allie Bossier, MD  clonazePAM (KLONOPIN) 1 MG tablet Take 1 tablet (1 mg total) by mouth 2 (two) times daily as needed (spasms). 09/23/13  Yes Tresa Garter, MD  diclofenac Sodium (VOLTAREN) 1 % GEL Apply 2 g topically as needed (Knee pain). 01/13/21  Yes Allie Bossier, MD  HYDROcodone-acetaminophen (NORCO/VICODIN) 5-325 MG tablet Take 2 tablets by mouth every 4 (four) hours as needed. 01/30/21   Marifer Hurd, MD  metoCLOPramide (REGLAN) 10 MG tablet Take 1 tablet (10 mg total) by mouth 3 (three) times daily with meals. Patient not taking: Reported on 01/29/2021 01/13/21 01/13/22  Allie Bossier, MD  naproxen (NAPROSYN) 375 MG tablet Take 1 tablet (375 mg total) by mouth 2 (two) times  daily. 01/30/21   Dakarri Kessinger, MD  ondansetron (ZOFRAN) 4 MG tablet Take 1 tablet (4 mg total) by mouth every 6 (six) hours as needed for nausea. Patient not taking: Reported on 01/29/2021 01/13/21   Allie Bossier, MD  propranolol ER (INDERAL LA) 60 MG 24 hr capsule Take 1 capsule (60 mg total) by mouth daily. Patient not taking: Reported on 01/18/2014 09/04/13   Charlett Blake, MD                                                                                                                                    Past Surgical History Past Surgical History:  Procedure Laterality Date   THYROIDECTOMY  2007   Family History Family History  Problem Relation Age of Onset   Cancer Father    Diabetes Father    Hypertension Father    Diabetes Other     Social History Social History  Tobacco Use   Smoking status: Former   Smokeless tobacco: Never   Tobacco comments:    electronic cigs.since 03/2012  Vaping Use   Vaping Use: Every day  Substance Use Topics   Alcohol use: Not Currently    Comment: occas.   Drug use: No   Allergies Patient has no known allergies.  Review of Systems Review of Systems  Musculoskeletal:  Positive for arthralgias, myalgias and neck pain.  Skin:  Positive for wound.   Physical Exam Vital Signs  I have reviewed the triage vital signs BP (!) 145/80    Pulse 78    Temp 99.3 F (37.4 C) (Oral)    Resp 16    Ht 5\' 11"  (1.803 m)    Wt 117.9 kg    LMP 01/09/2021 (Approximate)    SpO2 96%    BMI 36.26 kg/m   Physical Exam Vitals and nursing note reviewed.  Constitutional:      General: She is not in acute distress.    Appearance: She is well-developed.  HENT:     Head: Normocephalic.  Eyes:     Conjunctiva/sclera: Conjunctivae normal.  Cardiovascular:     Rate and Rhythm: Normal rate and regular rhythm.     Heart sounds: No murmur heard. Pulmonary:     Effort: Pulmonary effort is normal. No respiratory distress.     Breath sounds: Normal  breath sounds.  Abdominal:     Palpations: Abdomen is soft.     Tenderness: There is no abdominal tenderness.  Musculoskeletal:        General: Tenderness (Right shoulder, right trapezius) present. No swelling.     Cervical back: Neck supple.  Skin:    General: Skin is warm and dry.     Capillary Refill: Capillary refill takes less than 2 seconds.     Findings: Lesion (5 cm laceration to the right cheek into the nasal ala) present.  Neurological:     Mental Status: She is alert.  Psychiatric:        Mood and Affect: Mood normal.    ED Results and Treatments Labs (all labs ordered are listed, but only abnormal results are displayed) Labs Reviewed - No data to display                                                                                                                        Radiology No results found.  Pertinent labs & imaging results that were available during my care of the patient were reviewed by me and considered in my medical decision making (see MDM for details).  Medications Ordered in ED Medications  HYDROcodone-acetaminophen (NORCO/VICODIN) 5-325 MG per tablet 1 tablet (1 tablet Oral Given 01/29/21 0931)  HYDROcodone-acetaminophen (NORCO/VICODIN) 5-325 MG per tablet 1 tablet (1 tablet Oral Given 01/29/21 1100)  lidocaine (PF) (XYLOCAINE) 1 % injection 5 mL (5 mLs Intradermal Given by Other 01/29/21 1203)  lidocaine-EPINEPHrine-tetracaine (LET) topical gel (3 mLs Topical Given 01/29/21 1244)  HYDROcodone-acetaminophen (  NORCO/VICODIN) 5-325 MG per tablet 1 tablet (1 tablet Oral Given 01/29/21 1358)  Tdap (BOOSTRIX) injection 0.5 mL (0.5 mLs Intramuscular Given 01/29/21 1438)                                                                                                                                     Procedures .Marland KitchenLaceration Repair  Date/Time: 01/30/2021 5:38 PM Performed by: Teressa Lower, MD Authorized by: Teressa Lower, MD   Laceration details:    Location:   Face   Face location:  R cheek   Length (cm):  5 Pre-procedure details:    Preparation:  Patient was prepped and draped in usual sterile fashion Treatment:    Area cleansed with:  Chlorhexidine   Amount of cleaning:  Standard   Irrigation solution:  Sterile saline   Irrigation method:  Pressure wash Skin repair:    Repair method:  Staples   Number of staples:  7 Approximation:    Approximation:  Close Repair type:    Repair type:  Simple Post-procedure details:    Dressing:  Non-adherent dressing   Procedure completion:  Tolerated well, no immediate complications  (including critical care time)  Medical Decision Making / ED Course   This patient presents to the ED for concern of Facial laceration, fall, shoulder pain, this involves an extensive number of treatment options, and is a complaint that carries with it a high risk of complications and morbidity.  The differential diagnosis includes laceration, fracture, dislocation, muscle strain   Additional history obtained: -External records from outside source obtained and reviewed including: Chart review including previous notes, labs, imaging, consultation notes     Imaging Studies ordered: -I ordered imaging studies including CT head, maxillofacial, C-spine, right shoulder x-ray -I independently visualized and interpreted imaging with radiology reads showing no acute traumatic injury -I agree with the radiologist interpretation   Medicines ordered and prescription drug management: -I ordered medication including Norco and Tdap for the patient's pain and laceration -Reevaluation of the patient after these medicines showed that the patient improved -I have reviewed the patients home medicines and have made adjustments as needed  ED Course: Patient seen for multiple complaints described above.  Facial laceration repaired at bedside with Prolene simple interrupted sutures.  X-rays with no injury to the shoulder, CT with no  injury to the head face or neck.  Tetanus updated and patient discharged.   Cardiac Monitoring: The patient was maintained on a cardiac monitor.  I personally viewed and interpreted the cardiac monitored which showed an underlying rhythm of: Normal sinus rhythm   Reevaluation: After the interventions noted above, I reevaluated the patient and found that they have :improved   Dispostion: Discharge      Final Clinical Impression(s) / ED Diagnoses Final diagnoses:  Facial laceration, initial encounter  Fall, initial encounter     @PCDICTATION @    Teressa Lower, MD 01/30/21 [phone]

## 2021-02-01 ENCOUNTER — Ambulatory Visit
Admission: EM | Admit: 2021-02-01 | Discharge: 2021-02-01 | Disposition: A | Discharge status: Home / Self Care | Payer: Medicaid Other

## 2021-02-01 ENCOUNTER — Other Ambulatory Visit: Payer: Self-pay

## 2021-02-01 NOTE — ED Triage Notes (Signed)
Pt c/o pain mostly located in neck and right shoulder although there is an underlying generalized pain. States was recently seen in ED and has since been "suffering" from pain. States called ED and was unable to get pain medications without reeval which prompted this visit.

## 2021-02-01 NOTE — ED Triage Notes (Signed)
It appears the ED sent rx to pharmacy. Rn called to verify rx were at pharmacy and not sold, pharmacy confirmed. Rn gave this information to pt who stated she did not know any rx was sent for her. Pt was dismissed from this visit to retrieve rx.

## 2021-02-02 ENCOUNTER — Other Ambulatory Visit: Payer: Self-pay

## 2021-02-02 ENCOUNTER — Encounter: Payer: Self-pay | Admitting: Emergency Medicine

## 2021-02-02 ENCOUNTER — Ambulatory Visit
Admission: EM | Admit: 2021-02-02 | Discharge: 2021-02-02 | Disposition: A | Discharge status: Home / Self Care | Payer: Medicaid Other | Attending: Physician Assistant | Admitting: Physician Assistant

## 2021-02-02 DIAGNOSIS — M25511 Pain in right shoulder: Secondary | ICD-10-CM

## 2021-02-02 MED ORDER — HYDROCODONE-ACETAMINOPHEN 5-325 MG PO TABS: 1.0000 | ORAL_TABLET | Freq: Four times a day (QID) | ORAL | 0 refills | Status: AC | PRN

## 2021-02-02 NOTE — ED Provider Notes (Signed)
[facility]    CSN: [id number] Arrival date & time: 02/02/21  1758      History   Chief Complaint Chief Complaint  Patient presents with   Medication Refill    HPI Regina Robertson is a 41 y.o. female.   Patient here today for evaluation of right shoulder pain that has been ongoing since a fall about a week ago.  She reports that she was given medication from the emergency department but states that the quantity was not sufficient.  She was given 8 tablets been instructed to take 2 tablets every 4-6 hours.  She states she was instructed to take 2 tablets because that was what was necessary to control pain.  She is here today requesting refill of hydrocodone.  She states she cannot take naproxen because it makes her hair fall out.  The history is provided by the patient.  Medication Refill  Past Medical History:  Diagnosis Date   Anxiety    Bilateral chronic knee pain    Thyroid disease     Patient Active Problem List   Diagnosis Date Noted   Ruptured ovarian cyst 01/12/2021   New onset type 2 diabetes mellitus ([facility]) 01/12/2021   Right lower quadrant abdominal pain 01/10/2021   Hyperglycemia 01/10/2021   Tremor 05/18/2013   Microcytic anemia 05/18/2013   Knee pain 05/18/2013   Tremors of nervous system 05/18/2013    Past Surgical History:  Procedure Laterality Date   THYROIDECTOMY  2007    OB History   No obstetric history on file.      Home Medications    Prior to Admission medications   Medication Sig Start Date End Date Taking? Authorizing Provider  HYDROcodone-acetaminophen (NORCO/VICODIN) 5-325 MG tablet Take 1-2 tablets by mouth every 6 (six) hours as needed for up to 2 days. 02/02/21 02/04/21 Yes Tomi Bamberger, PA-C  atorvastatin (LIPITOR) 40 MG tablet Take 1 tablet (40 mg total) by mouth daily. 01/14/21   Drema Dallas, MD  clonazePAM (KLONOPIN) 1 MG tablet Take 1 tablet (1 mg total) by mouth 2 (two) times daily as needed (spasms).  09/23/13   Quentin Angst, MD  diclofenac Sodium (VOLTAREN) 1 % GEL Apply 2 g topically as needed (Knee pain). 01/13/21   Drema Dallas, MD  metoCLOPramide (REGLAN) 10 MG tablet Take 1 tablet (10 mg total) by mouth 3 (three) times daily with meals. Patient not taking: Reported on 01/29/2021 01/13/21 01/13/22  Drema Dallas, MD  naproxen (NAPROSYN) 375 MG tablet Take 1 tablet (375 mg total) by mouth 2 (two) times daily. 01/30/21   Kommor, Madison, MD  ondansetron (ZOFRAN) 4 MG tablet Take 1 tablet (4 mg total) by mouth every 6 (six) hours as needed for nausea. Patient not taking: Reported on 01/29/2021 01/13/21   Drema Dallas, MD  propranolol ER (INDERAL LA) 60 MG 24 hr capsule Take 1 capsule (60 mg total) by mouth daily. 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   Drug use: No     Allergies   Naproxen   Review of Systems Review of Systems  Constitutional:  Negative for chills and fever.  Eyes:  Negative for discharge and redness.  Respiratory:  Negative for shortness of breath.   Gastrointestinal:  Negative for nausea and vomiting.  Musculoskeletal:  Positive for arthralgias.    Physical Exam Triage Vital Signs ED Triage Vitals [02/02/21 1822]  Enc Vitals Group     BP (!) 135/91     Pulse Rate 86     Resp 16     Temp 98 F (36.7 C)     Temp Source Oral     SpO2 94 %     Weight      Height      Head Circumference      Peak Flow      Pain Score 10     Pain Loc      Pain Edu?      Excl. in GC?    No data  found.  Updated Vital Signs BP (!) 135/91 (BP Location: Right Arm)    Pulse 86    Temp 98 F (36.7 C) (Oral)    Resp 16    LMP 01/09/2021 (Approximate)    SpO2 94%      Physical Exam Vitals and nursing note reviewed.  Constitutional:      Comments: In wheelchair- appears to be uncomfortable.   HENT:     Head: Normocephalic and atraumatic.     Mouth/Throat:     Pharynx: Oropharynx is clear.  Cardiovascular:     Rate and Rhythm: Normal rate.  Pulmonary:     Effort: Pulmonary effort is normal.  Neurological:     Mental Status: She is alert.  Psychiatric:        Mood and Affect: Mood normal.        Behavior: Behavior normal.     UC Treatments / Results  Labs (all labs ordered are listed, but only abnormal results are displayed) Labs Reviewed - No data to display  EKG   Radiology No results found.  Procedures Procedures (including critical care time)  Medications Ordered in UC Medications - No data to display  Initial Impression / Assessment and Plan / UC Course  I have reviewed the triage vital signs and the nursing notes.  Pertinent labs & imaging results that were available during my care of the patient were reviewed by me and considered in my medical decision making (see chart for details).   Discussed that urgent care does not typically refill narcotic medications. Will send in enough for 2 days of treatment but advised ultimately any further refills would need to come from her PCP or specialist. Patient expresses understanding.   Final Clinical Impressions(s) / UC Diagnoses   Final diagnoses:  Acute pain of right shoulder   Discharge Instructions   None    ED Prescriptions     Medication Sig Dispense Auth. Provider   HYDROcodone-acetaminophen (NORCO/VICODIN) 5-325 MG tablet Take 1-2 tablets by mouth every 6 (six) hours as needed for up to 2 days. 16 tablet Tomi Bamberger, PA-C      I have reviewed the PDMP during this encounter.   Tomi Bamberger, PA-C 02/02/21 1839

## 2021-02-02 NOTE — ED Triage Notes (Signed)
Patient states she was seen in ed for a fall, given pain medications, but not given enough of them. Reports still being in pain, requesting prescription refill

## 2021-02-05 ENCOUNTER — Other Ambulatory Visit: Payer: Self-pay

## 2021-02-05 ENCOUNTER — Encounter: Payer: Self-pay | Admitting: Emergency Medicine

## 2021-02-05 ENCOUNTER — Ambulatory Visit
Admission: EM | Admit: 2021-02-05 | Discharge: 2021-02-05 | Disposition: A | Discharge status: Home / Self Care | Payer: Medicaid Other | Attending: Internal Medicine | Admitting: Internal Medicine

## 2021-02-05 DIAGNOSIS — Z4802 Encounter for removal of sutures: Secondary | ICD-10-CM

## 2021-02-05 MED ORDER — CEPHALEXIN 500 MG PO CAPS: 500.0000 mg | ORAL_CAPSULE | Freq: Four times a day (QID) | ORAL | 0 refills | Status: AC

## 2021-02-05 NOTE — Discharge Instructions (Signed)
You have been prescribed antibiotic to treat possible infection of your laceration.  Please follow-up with primary care doctor for further evaluation and management.

## 2021-02-05 NOTE — ED Triage Notes (Signed)
Patient here for facial suture removal.  Sutures placed a week ago in the ED.

## 2021-02-05 NOTE — ED Provider Notes (Signed)
[facility]    CSN: [id number] Arrival date & time: 02/05/21  1454      History   Chief Complaint Chief Complaint  Patient presents with   Suture / Staple Removal    HPI Regina Robertson is a 41 y.o. female.   Patient presents for suture removal to right face.  Called to room by nursing staff during suture removal due to signs of possible infection to laceration.  Presented to the ED on 01/29/2021 and had 7 sutures placed in the right face after fall.    Suture / Staple Removal   Past Medical History:  Diagnosis Date   Anxiety    Bilateral chronic knee pain    Thyroid disease     Patient Active Problem List   Diagnosis Date Noted   Ruptured ovarian cyst 01/12/2021   New onset type 2 diabetes mellitus ([facility]) 01/12/2021   Right lower quadrant abdominal pain 01/10/2021   Hyperglycemia 01/10/2021   Tremor 05/18/2013   Microcytic anemia 05/18/2013   Knee pain 05/18/2013   Tremors of nervous system 05/18/2013    Past Surgical History:  Procedure Laterality Date   THYROIDECTOMY  2007    OB History   No obstetric history on file.      Home Medications    Prior to Admission medications   Medication Sig Start Date End Date Taking? Authorizing Provider  cephALEXin (KEFLEX) 500 MG capsule Take 1 capsule (500 mg total) by mouth 4 (four) times daily. 02/05/21  Yes , Rolly Salter E, FNP  atorvastatin (LIPITOR) 40 MG tablet Take 1 tablet (40 mg total) by mouth daily. 01/14/21   Drema Dallas, MD  clonazePAM (KLONOPIN) 1 MG tablet Take 1 tablet (1 mg total) by mouth 2 (two) times daily as needed (spasms). 09/23/13   Quentin Angst, MD  diclofenac Sodium (VOLTAREN) 1 % GEL Apply 2 g topically as needed (Knee pain). 01/13/21   Drema Dallas, MD  metoCLOPramide (REGLAN) 10 MG tablet Take 1 tablet (10 mg total) by mouth 3 (three) times daily with meals. Patient not taking: Reported on 01/29/2021 01/13/21 01/13/22  Drema Dallas, MD  naproxen (NAPROSYN) 375  MG tablet Take 1 tablet (375 mg total) by mouth 2 (two) times daily. 01/30/21   Kommor, Madison, MD  ondansetron (ZOFRAN) 4 MG tablet Take 1 tablet (4 mg total) by mouth every 6 (six) hours as needed for nausea. Patient not taking: Reported on 01/29/2021 01/13/21   Drema Dallas, MD  propranolol ER (INDERAL LA) 60 MG 24 hr capsule Take 1 capsule (60 mg total) by mouth daily. 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   Drug use: No     Allergies   Naproxen   Review of Systems Review of Systems Per HPI  Physical Exam Triage Vital Signs ED Triage Vitals  Enc Vitals Group     BP 02/05/21 1509 132/88     Pulse Rate 02/05/21 1509  93     Resp 02/05/21 1509 20     Temp 02/05/21 1509 98.7 F (37.1 C)     Temp Source 02/05/21 1509 Oral     SpO2 02/05/21 1509 95 %     Weight --      Height --      Head Circumference --      Peak Flow --      Pain Score 02/05/21 1539 0     Pain Loc --      Pain Edu? --      Excl. in GC? --    No data found.  Updated Vital Signs BP 132/88 (BP Location: Left Arm)    Pulse 93    Temp 98.7 F (37.1 C) (Oral)    Resp 20    LMP 01/09/2021 (Approximate)    SpO2 95%   Visual Acuity Right Eye Distance:   Left Eye Distance:   Bilateral Distance:    Right Eye Near:   Left Eye Near:    Bilateral Near:     Physical Exam Constitutional:      General: She is not in acute distress.    Appearance: Normal appearance. She is not toxic-appearing or diaphoretic.  HENT:     Head: Normocephalic and atraumatic.   Eyes:     Extraocular Movements: Extraocular movements intact.     Conjunctiva/sclera: Conjunctivae normal.  Pulmonary:     Effort: Pulmonary effort is normal.  Skin:    General: Skin is warm and dry.     Findings: Laceration present.     Comments: Healing laceration present to right side of face directly beside nose. Purulent drainage noted from right side of wound.  Wound edges are close approximated.  Sutures removed.  Neurological:     General: No focal deficit present.     Mental Status: She is alert and oriented to person, place, and time. Mental status is at baseline.  Psychiatric:        Mood and Affect: Mood normal.        Behavior: Behavior normal.        Thought Content: Thought content normal.        Judgment: Judgment normal.     UC Treatments / Results  Labs (all labs ordered are listed, but only abnormal results are displayed) Labs Reviewed - No data to display  EKG   Radiology No results found.  Procedures Procedures (including critical care time)  Medications Ordered in UC Medications - No data to display  Initial Impression / Assessment and Plan / UC Course  I have reviewed the triage vital signs and the nursing notes.  Pertinent labs & imaging results that were available during my care of the patient were reviewed by me and considered in my medical decision making (see chart for details).     Sutures removed from wound successfully and completely.  All sutures were removed per previous procedure note at ED. It does appear that part of the wound is showing signs of infection.  Wound edges appear well approximated.  Will treat with cephalexin antibiotic.  Patient to monitor wound as well.  Discussed strict return precautions.  Patient verbalized understanding and was agreeable with plan. Final Clinical Impressions(s) / UC Diagnoses   Final diagnoses:  Visit for suture removal     Discharge Instructions      You have been prescribed antibiotic  to treat possible infection of your laceration.  Please follow-up with primary care doctor for  further evaluation and management.    ED Prescriptions     Medication Sig Dispense Auth. Provider   cephALEXin (KEFLEX) 500 MG capsule Take 1 capsule (500 mg total) by mouth 4 (four) times daily. 28 capsule [place], Acie FredricksonHaley E, [place]NP      PDMP not reviewed this encounter.   Gustavus BryantMound, Jett Fukuda E, [place] 02/05/21 1546

## 2021-02-10 ENCOUNTER — Inpatient Hospital Stay (INDEPENDENT_AMBULATORY_CARE_PROVIDER_SITE_OTHER): Payer: Medicaid Other | Admitting: Primary Care

## 2021-02-16 ENCOUNTER — Emergency Department (HOSPITAL_COMMUNITY)
Admission: EM | Admit: 2021-02-16 | Discharge: 2021-02-16 | Disposition: A | Discharge status: Home / Self Care | Payer: Medicaid Other | Attending: Emergency Medicine | Admitting: Emergency Medicine

## 2021-02-16 ENCOUNTER — Encounter (HOSPITAL_COMMUNITY): Payer: Self-pay

## 2021-02-16 ENCOUNTER — Emergency Department (HOSPITAL_COMMUNITY): Payer: Medicaid Other

## 2021-02-16 ENCOUNTER — Other Ambulatory Visit: Payer: Self-pay

## 2021-02-16 DIAGNOSIS — R109 Unspecified abdominal pain: Secondary | ICD-10-CM | POA: Insufficient documentation

## 2021-02-16 DIAGNOSIS — D649 Anemia, unspecified: Secondary | ICD-10-CM | POA: Insufficient documentation

## 2021-02-16 DIAGNOSIS — Z20822 Contact with and (suspected) exposure to covid-19: Secondary | ICD-10-CM | POA: Diagnosis not present

## 2021-02-16 DIAGNOSIS — R4182 Altered mental status, unspecified: Secondary | ICD-10-CM | POA: Insufficient documentation

## 2021-02-16 DIAGNOSIS — R110 Nausea: Secondary | ICD-10-CM

## 2021-02-16 DIAGNOSIS — R112 Nausea with vomiting, unspecified: Secondary | ICD-10-CM | POA: Insufficient documentation

## 2021-02-16 DIAGNOSIS — R7309 Other abnormal glucose: Secondary | ICD-10-CM | POA: Diagnosis not present

## 2021-02-16 DIAGNOSIS — N9489 Other specified conditions associated with female genital organs and menstrual cycle: Secondary | ICD-10-CM | POA: Insufficient documentation

## 2021-02-16 DIAGNOSIS — R509 Fever, unspecified: Secondary | ICD-10-CM | POA: Insufficient documentation

## 2021-02-16 DIAGNOSIS — E860 Dehydration: Secondary | ICD-10-CM | POA: Diagnosis not present

## 2021-02-16 LAB — RAPID URINE DRUG SCREEN, HOSP PERFORMED
Amphetamines: NOT DETECTED
Barbiturates: NOT DETECTED
Benzodiazepines: NOT DETECTED
Cocaine: NOT DETECTED
Opiates: NOT DETECTED
Tetrahydrocannabinol: NOT DETECTED

## 2021-02-16 LAB — COMPREHENSIVE METABOLIC PANEL
ALT: 36 U/L (ref 0–44)
AST: 37 U/L (ref 15–41)
Albumin: 4.2 g/dL (ref 3.5–5.0)
Alkaline Phosphatase: 78 U/L (ref 38–126)
Anion gap: 9 (ref 5–15)
BUN: 7 mg/dL (ref 6–20)
CO2: 26 mmol/L (ref 22–32)
Calcium: 9.5 mg/dL (ref 8.9–10.3)
Chloride: 102 mmol/L (ref 98–111)
Creatinine, Ser: 0.64 mg/dL (ref 0.44–1.00)
GFR, Estimated: >60 mL/min (ref >60)
Glucose, Bld: 154 mg/dL — ABNORMAL HIGH (ref 70–99)
Potassium: 3.8 mmol/L (ref 3.5–5.1)
Sodium: 137 mmol/L (ref 135–145)
Total Bilirubin: 0.8 mg/dL (ref 0.3–1.2)
Total Protein: 8.6 g/dL — ABNORMAL HIGH (ref 6.5–8.1)

## 2021-02-16 LAB — RESP PANEL BY RT-PCR (FLU A&B, COVID) ARPGX2
Influenza A by PCR: NEGATIVE
Influenza B by PCR: NEGATIVE
SARS Coronavirus 2 by RT PCR: NEGATIVE

## 2021-02-16 LAB — URINALYSIS, ROUTINE W REFLEX MICROSCOPIC
Bilirubin Urine: NEGATIVE
Glucose, UA: 50 mg/dL — AB
Hgb urine dipstick: NEGATIVE
Ketones, ur: 80 mg/dL — AB
Leukocytes,Ua: NEGATIVE
Nitrite: NEGATIVE
Protein, ur: NEGATIVE mg/dL
Specific Gravity, Urine: 1.03 (ref 1.005–1.030)
pH: 8 (ref 5.0–8.0)

## 2021-02-16 LAB — CBC WITH DIFFERENTIAL/PLATELET
Abs Immature Granulocytes: 0.13 10*3/uL — ABNORMAL HIGH (ref 0.00–0.07)
Basophils Absolute: 0 10*3/uL (ref 0.0–0.1)
Basophils Relative: 0 %
Eosinophils Absolute: 0 10*3/uL (ref 0.0–0.5)
Eosinophils Relative: 0 %
HCT: 38.2 % (ref 36.0–46.0)
Hemoglobin: 11.8 g/dL — ABNORMAL LOW (ref 12.0–15.0)
Immature Granulocytes: 1 %
Lymphocytes Relative: 23 %
Lymphs Abs: 2.1 10*3/uL (ref 0.7–4.0)
MCH: 24 pg — ABNORMAL LOW (ref 26.0–34.0)
MCHC: 30.9 g/dL (ref 30.0–36.0)
MCV: 77.6 fL — ABNORMAL LOW (ref 80.0–100.0)
Monocytes Absolute: 0.4 10*3/uL (ref 0.1–1.0)
Monocytes Relative: 4 %
Neutro Abs: 6.4 10*3/uL (ref 1.7–7.7)
Neutrophils Relative %: 72 %
Platelets: 539 10*3/uL — ABNORMAL HIGH (ref 150–400)
RBC: 4.92 MIL/uL (ref 3.87–5.11)
RDW: 17.6 % — ABNORMAL HIGH (ref 11.5–15.5)
WBC: 9.1 10*3/uL (ref 4.0–10.5)
nRBC: 0 % (ref 0.0–0.2)

## 2021-02-16 LAB — I-STAT BETA HCG BLOOD, ED (MC, WL, AP ONLY): I-stat hCG, quantitative: <5 m[IU]/mL (ref <5)

## 2021-02-16 MED ORDER — PANTOPRAZOLE SODIUM 20 MG PO TBEC: 20.0000 mg | DELAYED_RELEASE_TABLET | Freq: Every day | ORAL | 0 refills | Status: AC

## 2021-02-16 MED ORDER — SODIUM CHLORIDE 0.9 % IV BOLUS
1000.0000 mL | Freq: Once | INTRAVENOUS | Status: AC
  Administered 2021-02-16: 1000 mL via INTRAVENOUS

## 2021-02-16 MED ORDER — PROCHLORPERAZINE EDISYLATE 10 MG/2ML IJ SOLN
10.0000 mg | Freq: Once | INTRAMUSCULAR | Status: AC
  Administered 2021-02-16: 10 mg via INTRAVENOUS
  Filled 2021-02-16: qty 2

## 2021-02-16 MED ORDER — ONDANSETRON HCL 4 MG/2ML IJ SOLN
4.0000 mg | Freq: Once | INTRAMUSCULAR | Status: AC
Start: 2021-02-16 — End: 2021-02-16
  Administered 2021-02-16: 4 mg via INTRAVENOUS
  Filled 2021-02-16: qty 2

## 2021-02-16 MED ORDER — IOHEXOL 300 MG/ML  SOLN
100.0000 mL | Freq: Once | INTRAMUSCULAR | Status: AC | PRN
  Administered 2021-02-16: 100 mL via INTRAVENOUS

## 2021-02-16 MED ORDER — HYDROMORPHONE HCL 1 MG/ML IJ SOLN
1.0000 mg | Freq: Once | INTRAMUSCULAR | Status: AC
Start: 2021-02-16 — End: 2021-02-16
  Administered 2021-02-16: 1 mg via INTRAVENOUS
  Filled 2021-02-16: qty 1

## 2021-02-16 MED ORDER — PANTOPRAZOLE SODIUM 40 MG IV SOLR
40.0000 mg | Freq: Once | INTRAVENOUS | Status: AC
Start: 2021-02-16 — End: 2021-02-16
  Administered 2021-02-16: 40 mg via INTRAVENOUS
  Filled 2021-02-16: qty 40

## 2021-02-16 MED ORDER — METOCLOPRAMIDE HCL 5 MG/ML IJ SOLN
10.0000 mg | Freq: Once | INTRAMUSCULAR | Status: AC
  Administered 2021-02-16: 10 mg via INTRAVENOUS
  Filled 2021-02-16: qty 2

## 2021-02-16 MED ORDER — PROMETHAZINE HCL 25 MG RE SUPP: 25.0000 mg | Freq: Four times a day (QID) | RECTAL | 0 refills | Status: AC | PRN

## 2021-02-16 MED ORDER — SODIUM CHLORIDE (PF) 0.9 % IJ SOLN
INTRAMUSCULAR | Status: AC
  Filled 2021-02-16: qty 50

## 2021-02-16 NOTE — Discharge Instructions (Signed)
Take liquids only today.  Follow-up with a family doctor or [facility] gastroenterology if not improving.  We are starting you on a medicine to decrease the acid in your stomach

## 2021-02-16 NOTE — ED Notes (Signed)
Pt states understanding of dc instructions, importance of follow up, and prescriptions. Pt denies questions or concerns upon dc. Pt assisted into wheelchair and wheeled out to ed lobby to await social work arranged ride. Pt states she has no way home.  ED contacted social work who agreed to help arrange transport for pt.  Pt states understanding. No belongings left in room upon dc.Marland Kitchen

## 2021-02-16 NOTE — ED Triage Notes (Signed)
Pt bib ems for possible covid exposure.  Pt states feeling unwell times several days.  Pt hx of HTN but states she hasnt been able to take meds d/t nausea, vomiting, fevers, and generalized feeling unwell.

## 2021-02-16 NOTE — ED Provider Notes (Signed)
[facility] DEPT Provider Note   CSN: [id number] Arrival date & time: 02/16/21  [id number]     History  Chief Complaint  Patient presents with   Fever   Fatigue   Nausea    Regina Robertson is a 41 y.o. female.  Patient presents complaining of nausea and fever.  And vomiting she has a history of thyroid disease and anxiety, and elevated lipids  The history is provided by the patient and medical records. No language interpreter was used.  Fever Temp source:  Subjective Severity:  Mild Onset quality:  Sudden Timing:  Intermittent Progression:  Waxing and waning Chronicity:  New Relieved by:  Nothing Worsened by:  Nothing Ineffective treatments:  None tried Associated symptoms: vomiting   Associated symptoms: no chest pain, no congestion, no cough, no diarrhea, no headaches and no rash       Home Medications Prior to Admission medications   Medication Sig Start Date End Date Taking? Authorizing Provider  pantoprazole (PROTONIX) 20 MG tablet Take 1 tablet (20 mg total) by mouth daily. 02/16/21  Yes Milton Ferguson, MD  promethazine (PHENERGAN) 25 MG suppository Place 1 suppository (25 mg total) rectally every 6 (six) hours as needed for nausea or vomiting. 02/16/21  Yes Milton Ferguson, MD  atorvastatin (LIPITOR) 40 MG tablet Take 1 tablet (40 mg total) by mouth daily. 01/14/21   Allie Bossier, MD  cephALEXin (KEFLEX) 500 MG capsule Take 1 capsule (500 mg total) by mouth 4 (four) times daily. 02/05/21   Teodora Medici, FNP  clonazePAM (KLONOPIN) 1 MG tablet Take 1 tablet (1 mg total) by mouth 2 (two) times daily as needed (spasms). 09/23/13   Tresa Garter, MD  diclofenac Sodium (VOLTAREN) 1 % GEL Apply 2 g topically as needed (Knee pain). 01/13/21   Allie Bossier, MD  metoCLOPramide (REGLAN) 10 MG tablet Take 1 tablet (10 mg total) by mouth 3 (three) times daily with meals. Patient not taking: Reported on 01/29/2021 01/13/21 01/13/22  Allie Bossier, MD  naproxen (NAPROSYN) 375 MG tablet Take 1 tablet (375 mg total) by mouth 2 (two) times daily. 01/30/21   Kommor, [place], MD  ondansetron (ZOFRAN) 4 MG tablet Take 1 tablet (4 mg total) by mouth every 6 (six) hours as needed for nausea. Patient not taking: Reported on 01/29/2021 01/13/21   Allie Bossier, MD  propranolol ER (INDERAL LA) 60 MG 24 hr capsule Take 1 capsule (60 mg total) by mouth daily. Patient not taking: Reported on 01/18/2014 09/04/13   Charlett Blake, MD      Allergies    Naproxen    Review of Systems   Review of Systems  Constitutional:  Positive for fever. Negative for appetite change and fatigue.  HENT:  Negative for congestion, ear discharge and sinus pressure.   Eyes:  Negative for discharge.  Respiratory:  Negative for cough.   Cardiovascular:  Negative for chest pain.  Gastrointestinal:  Positive for vomiting. Negative for abdominal pain and diarrhea.  Genitourinary:  Negative for frequency and hematuria.  Musculoskeletal:  Negative for back pain.  Skin:  Negative for rash.  Neurological:  Negative for seizures and headaches.  Psychiatric/Behavioral:  Negative for hallucinations.    Physical Exam Updated Vital Signs BP (!) 202/106    Pulse 66    Temp 98.3 F (36.8 C) (Oral)    Resp 16    Ht 5\' 11"  (1.803 m)    Wt 117.9 kg  SpO2 100%    BMI 36.26 kg/m  Physical Exam Vitals and nursing note reviewed.  Constitutional:      Appearance: She is well-developed.  HENT:     Head: Normocephalic.     Nose: Nose normal.  Eyes:     General: No scleral icterus.    Conjunctiva/sclera: Conjunctivae normal.  Neck:     Thyroid: No thyromegaly.  Cardiovascular:     Rate and Rhythm: Normal rate and regular rhythm.     Heart sounds: No murmur heard.   No friction rub. No gallop.  Pulmonary:     Breath sounds: No stridor. No wheezing or rales.  Chest:     Chest wall: No tenderness.  Abdominal:     General: There is no distension.     Tenderness:  There is abdominal tenderness. There is no rebound.  Musculoskeletal:        General: Normal range of motion.     Cervical back: Neck supple.  Lymphadenopathy:     Cervical: No cervical adenopathy.  Skin:    Findings: No erythema or rash.  Neurological:     Mental Status: She is alert and oriented to person, place, and time.     Motor: No abnormal muscle tone.     Coordination: Coordination normal.  Psychiatric:        Behavior: Behavior normal.    ED Results / Procedures / Treatments   Labs (all labs ordered are listed, but only abnormal results are displayed) Labs Reviewed  CBC WITH DIFFERENTIAL/PLATELET - Abnormal; Notable for the following components:      Result Value   Hemoglobin 11.8 (*)    MCV 77.6 (*)    MCH 24.0 (*)    RDW 17.6 (*)    Platelets 539 (*)    Abs Immature Granulocytes 0.13 (*)    All other components within normal limits  COMPREHENSIVE METABOLIC PANEL - Abnormal; Notable for the following components:   Glucose, Bld 154 (*)    Total Protein 8.6 (*)    All other components within normal limits  URINALYSIS, ROUTINE W REFLEX MICROSCOPIC - Abnormal; Notable for the following components:   Color, Urine COLORLESS (*)    Glucose, UA 50 (*)    Ketones, ur 80 (*)    All other components within normal limits  RESP PANEL BY RT-PCR (FLU A&B, COVID) ARPGX2  RAPID URINE DRUG SCREEN, HOSP PERFORMED  I-STAT BETA HCG BLOOD, ED ([facility], [facility], [facility] ONLY)    EKG None  Radiology CT Head Wo Contrast  Result Date: 02/16/2021 CLINICAL DATA:  Head trauma EXAM: CT HEAD WITHOUT CONTRAST TECHNIQUE: Contiguous axial images were obtained from the base of the skull through the vertex without intravenous contrast. RADIATION DOSE REDUCTION: This exam was performed according to the departmental dose-optimization program which includes automated exposure control, adjustment of the mA and/or kV according to patient size and/or use of iterative reconstruction technique. COMPARISON:  Head CT  dated June 29, 2021 FINDINGS: Brain: No evidence of acute infarction, hemorrhage, hydrocephalus, extra-axial collection or mass lesion/mass effect. Vascular: No hyperdense vessel or unexpected calcification. Skull: Normal. Negative for fracture or focal lesion. Sinuses/Orbits: No acute finding. Other: None. IMPRESSION: No acute intracranial abnormality Electronically Signed   By: Yetta Glassman M.D.   On: 02/16/2021 14:49   CT ABDOMEN PELVIS W CONTRAST  Result Date: 02/16/2021 CLINICAL DATA:  Abdominal pain with nausea, vomiting, and fevers EXAM: CT ABDOMEN AND PELVIS WITH CONTRAST TECHNIQUE: Multidetector CT imaging of the abdomen  and pelvis was performed using the standard protocol following bolus administration of intravenous contrast. RADIATION DOSE REDUCTION: This exam was performed according to the departmental dose-optimization program which includes automated exposure control, adjustment of the mA and/or kV according to patient size and/or use of iterative reconstruction technique. CONTRAST:  134mL OMNIPAQUE IOHEXOL 300 MG/ML  SOLN COMPARISON:  CT January 11, 2021. FINDINGS: Lower chest: No acute abnormality. Hepatobiliary: Hepatomegaly with marked hepatic steatosis. No suspicious hepatic lesion. Gallbladder is unremarkable. No biliary ductal dilation. Pancreas: No pancreatic ductal dilation or evidence of acute inflammation. Spleen: Normal in size without focal abnormality. Adrenals/Urinary Tract: Bilateral adrenal glands are unremarkable. No hydronephrosis. Kidneys demonstrate symmetric enhancement and excretion of contrast material. No suspicious filling defect visualized within the opacified portions of the collecting system or proximal ureters on delayed imaging. No solid enhancing renal mass. Urinary bladder is unremarkable. Stomach/Bowel: No enteric contrast was administered. Tiny hiatal hernia. Stomach is minimally distended without obvious wall thickening. No pathologic dilation of small or  large bowel. Terminal ileum and appendix appear normal. No evidence of acute bowel inflammation. Vascular/Lymphatic: No abdominal aortic aneurysm. No pathologically enlarged abdominal or pelvic lymph nodes. Reproductive: Unremarkable premenopausal CT appearance of the uterus and bilateral adnexa. Other: Trace pelvic free fluid, within physiologic normal limits. Musculoskeletal: No acute or significant osseous findings. IMPRESSION: 1. No acute abnormality in the abdomen or pelvis. 2. Hepatomegaly with marked hepatic steatosis. Electronically Signed   By: Dahlia Bailiff M.D.   On: 02/16/2021 13:05   DG Chest Port 1 View  Result Date: 02/16/2021 CLINICAL DATA:  Shortness of breath, fatigue, fever, and nausea. EXAM: PORTABLE CHEST 1 VIEW COMPARISON:  10/12/2020 FINDINGS: The cardiomediastinal silhouette is within normal limits. The lungs are better inflated than on the prior study. Patchy right lower lung opacity on the prior study appears to have resolved, with mildly asymmetrically increased density over the right lower lung on the current study favored to be due to overlying soft tissues. No convincing airspace consolidation, edema, sizable pleural effusion, or pneumothorax is identified. No acute osseous abnormality is seen. IMPRESSION: No active disease. Electronically Signed   By: Logan Bores M.D.   On: 02/16/2021 11:43    Procedures Procedures    Medications Ordered in ED Medications  sodium chloride 0.9 % bolus 1,000 mL (0 mLs Intravenous Stopped 02/16/21 1159)  ondansetron (ZOFRAN) injection 4 mg (4 mg Intravenous Given 02/16/21 0939)  metoCLOPramide (REGLAN) injection 10 mg (10 mg Intravenous Given 02/16/21 1148)  HYDROmorphone (DILAUDID) injection 1 mg (1 mg Intravenous Given 02/16/21 1148)  iohexol (OMNIPAQUE) 300 MG/ML solution 100 mL (100 mLs Intravenous Contrast Given 02/16/21 1229)  sodium chloride (PF) 0.9 % injection (  Given by Other 02/16/21 1311)  prochlorperazine (COMPAZINE) injection  10 mg (10 mg Intravenous Given 02/16/21 1406)  sodium chloride 0.9 % bolus 1,000 mL (1,000 mLs Intravenous New Bag/Given 02/16/21 1551)  pantoprazole (PROTONIX) injection 40 mg (40 mg Intravenous Given 02/16/21 1551)    ED Course/ Medical Decision Making/ A&P                           Medical Decision Making Amount and/or Complexity of Data Reviewed Labs: ordered. Radiology: ordered.  Risk Prescription drug management.   Patient with persistent vomiting.  Labs and CT scan unremarkable for mild dehydration.  Patient has been hydrated.  She will be discharged home with Phenergan and Protonix and follow-up with family doctor or GI   This  patient presents to the ED for concern of vomiting and pain, this involves an extensive number of treatment options, and is a complaint that carries with it a high risk of complications and morbidity.  The differential diagnosis includes gallbladder or appendix, GERD   Co morbidities that complicate the patient evaluation  Thyroid disease and elevated lipids   Additional history obtained:  Additional history obtained from patient External records from outside source obtained and reviewed including hospital records   Lab Tests:  I Ordered, and personally interpreted labs.  The pertinent results include: CBC unremarkable except for mild anemia at 11.8, glucose elevated 154   Imaging Studies ordered:  I ordered imaging studies including CT abdomen I independently visualized and interpreted imaging which showed unremarkable I agree with the radiologist interpretation   Cardiac Monitoring:  The patient was maintained on a cardiac monitor.  I personally viewed and interpreted the cardiac monitored which showed an underlying rhythm of: Normal sinus rhythm   Medicines ordered and prescription drug management:  I ordered medication including Dilaudid for pain, Zofran for nausea, Protonix for GERD Reevaluation of the patient after these  medicines showed that the patient improved I have reviewed the patients home medicines and have made adjustments as needed   Test Considered:  MRI of the abdomen   Critical Interventions:  IV fluids pain medicine and protonic IV   Consultations Obtained:  No consult  Problem List / ED Course:  Thyroid disease, GERD   Reevaluation:  After the interventions noted above, I reevaluated the patient and found that they have :improved   Social Determinants of Health:  None   Dispostion:  After consideration of the diagnostic results and the patients response to treatment, I feel that the patent would benefit from started on Protonix and follow-up with PCP.         Final Clinical Impression(s) / ED Diagnoses Final diagnoses:  Nausea    Rx / DC Orders ED Discharge Orders          Ordered    promethazine (PHENERGAN) 25 MG suppository  Every 6 hours PRN        02/16/21 1637    pantoprazole (PROTONIX) 20 MG tablet  Daily        02/16/21 1637              Milton Ferguson, MD 02/18/21 [id number]

## 2021-02-16 NOTE — TOC Initial Note (Signed)
Transition of Care [facility]) - Initial/Assessment Note    Patient Details  Name: Regina Robertson MRN: [id number] Date of Birth: [birth year]  [facility] CM/SW Contact:    Carmina Miller, LCSWA Phone Number: 02/16/2021, 6:12 PM  Clinical Narrative:                  CSW contacted to set up transportation for pt.        Patient Goals and CMS Choice        Expected Discharge Plan and Services                                                Prior Living Arrangements/Services                       Activities of Daily Living      Permission Sought/Granted                  Emotional Assessment              Admission diagnosis:  chills;fever;exposure to Covid Patient Active Problem List   Diagnosis Date Noted   Ruptured ovarian cyst 01/12/2021   New onset type 2 diabetes mellitus (HCC) 01/12/2021   Right lower quadrant abdominal pain 01/10/2021   Hyperglycemia 01/10/2021   Tremor 05/18/2013   Microcytic anemia 05/18/2013   Knee pain 05/18/2013   Tremors of nervous system 05/18/2013   PCP:  Patient, No Pcp Per (Inactive) Pharmacy:   [facility] [phone] - [place], [place] - [phone] AT [facility] [address] & [address] [place] [address] Phone: [phone] Fax: [phone]  [facility] [address] [place] [address] Phone: [phone] Fax: [phone]  [id number] - [place], [place] - [address] [place] [address] Phone: [phone] Fax: [phone]     Social Determinants of Health (SDOH) Interventions    Readmission Risk Interventions No flowsheet data found.

## 2021-03-13 ENCOUNTER — Inpatient Hospital Stay (INDEPENDENT_AMBULATORY_CARE_PROVIDER_SITE_OTHER): Payer: Medicaid Other | Admitting: Primary Care

## 2021-03-30 ENCOUNTER — Ambulatory Visit: Payer: Medicaid Other | Admitting: Dietician

## 2022-01-26 ENCOUNTER — Other Ambulatory Visit (HOSPITAL_COMMUNITY): Payer: Self-pay

## 2023-05-13 ENCOUNTER — Other Ambulatory Visit: Payer: Self-pay | Admitting: Family Medicine

## 2023-05-13 DIAGNOSIS — E010 Iodine-deficiency related diffuse (endemic) goiter: Secondary | ICD-10-CM

## 2023-05-21 ENCOUNTER — Other Ambulatory Visit: Payer: MEDICAID

## 2023-09-06 ENCOUNTER — Encounter: Payer: Self-pay | Admitting: Family Medicine

## 2023-09-06 ENCOUNTER — Other Ambulatory Visit: Payer: Self-pay | Admitting: Family Medicine

## 2023-09-06 DIAGNOSIS — G4453 Primary thunderclap headache: Secondary | ICD-10-CM

## 2023-09-09 ENCOUNTER — Inpatient Hospital Stay
Admission: RE | Admit: 2023-09-09 | Discharge: 2023-09-09 | Discharge status: Home / Self Care | Payer: MEDICAID | Source: Ambulatory Visit | Attending: Family Medicine | Admitting: Family Medicine

## 2023-09-09 DIAGNOSIS — E010 Iodine-deficiency related diffuse (endemic) goiter: Secondary | ICD-10-CM

## 2023-09-20 ENCOUNTER — Other Ambulatory Visit: Payer: Self-pay | Admitting: Family Medicine

## 2023-09-20 DIAGNOSIS — G4453 Primary thunderclap headache: Secondary | ICD-10-CM

## 2023-09-21 ENCOUNTER — Other Ambulatory Visit: Payer: MEDICAID

## 2023-10-23 ENCOUNTER — Ambulatory Visit (INDEPENDENT_AMBULATORY_CARE_PROVIDER_SITE_OTHER): Payer: MEDICAID | Admitting: Neurology

## 2023-10-23 ENCOUNTER — Encounter: Payer: Self-pay | Admitting: Neurology

## 2023-10-23 VITALS — BP 121/78 | HR 78 | Ht 71.0 in | Wt 236.0 lb

## 2023-10-23 DIAGNOSIS — F419 Anxiety disorder, unspecified: Secondary | ICD-10-CM

## 2023-10-23 DIAGNOSIS — M25562 Pain in left knee: Secondary | ICD-10-CM

## 2023-10-23 DIAGNOSIS — R0683 Snoring: Secondary | ICD-10-CM

## 2023-10-23 DIAGNOSIS — R253 Fasciculation: Secondary | ICD-10-CM

## 2023-10-23 DIAGNOSIS — Z9189 Other specified personal risk factors, not elsewhere classified: Secondary | ICD-10-CM

## 2023-10-23 DIAGNOSIS — G4489 Other headache syndrome: Secondary | ICD-10-CM

## 2023-10-23 DIAGNOSIS — R519 Headache, unspecified: Secondary | ICD-10-CM | POA: Diagnosis not present

## 2023-10-23 DIAGNOSIS — R351 Nocturia: Secondary | ICD-10-CM

## 2023-10-23 DIAGNOSIS — M25521 Pain in right elbow: Secondary | ICD-10-CM

## 2023-10-23 DIAGNOSIS — G8929 Other chronic pain: Secondary | ICD-10-CM

## 2023-10-23 NOTE — Patient Instructions (Addendum)
 It was nice to meet you today.   As discussed, your headaches are likely due to a combination of factors.   Here is what we discussed today and my recommendations for you:   Please remember, common headache triggers are: sleep deprivation, dehydration, overheating, stress, hypoglycemia or skipping meals and blood sugar fluctuations, excessive pain medications or excessive alcohol use or caffeine withdrawal. Some people have food triggers such as aged cheese, orange juice or chocolate, especially dark chocolate, or MSG (monosodium glutamate). Try to avoid these headache triggers as much possible. It may be helpful to keep a headache diary to figure out what makes your headaches worse or brings them on and what alleviates them. Some people report headache onset after exercise but studies have shown that regular exercise may actually prevent headaches from coming. If you have exercise-induced headaches, please make sure that you drink plenty of fluid before and after exercising and that you do not over do it and do not overheat. Please talk to your primary care about your chronic knee pain on the left side, you may benefit from seeing an orthopedic doctor for this. I recommend you get a full, dilated eye examination as you have not seen an eye doctor in over 2 years.  Strain on the eyes can cause headaches.   I will order a brain scan, called MRI and call you with the test results. We will have to schedule you for this on a separate date. This test requires authorization from your insurance, and we will take care of the insurance process. I will order a sleep study to look for signs of obstructive sleep apnea (aka OSA). As explained, the long-term risks and ramifications of untreated moderate to severe obstructive sleep apnea may include (but are not limited to): increased risk for cardiovascular disease, including congestive heart failure, stroke, difficult to control hypertension, treatment resistant  obesity, arrhythmias, especially irregular heartbeat commonly known as A. Fib. (atrial fibrillation); even type 2 diabetes has been linked to untreated OSA.  We will keep you posted as to your test results by phone call for now.  We will plan to follow-up accordingly.   Please talk to your primary care about your anxiety medication called clonazepam  and whether or not she recommends you continue with it.  As discussed, I will not be able to prescribe the clonazepam  for you. We will do an EEG (brainwave test), which we will schedule. We will call you with the results.

## 2023-10-23 NOTE — Progress Notes (Addendum)
 Subjective:    Patient ID: Regina Robertson is a 43 y.o. female.  HPI    True Mar, MD, PhD [facility] [address] [address] [place], [place] [address]  Dear Kevin,  I saw your patient, Regina Robertson, upon your kind request in my neurologic clinic today for evaluation of her recurrent headaches.  The patient is unaccompanied today.  As you know, Regina Robertson is a 43 year old female with an underlying medical history of thyroid  disease, anxiety, knee pain, hypertension, hyperlipidemia, vitamin D deficiency, history of ulnar neuropathy with status post surgery, diabetes, and obesity, who reports intermittent lightninglike sensation in her head.  She reports that her symptoms started after her right elbow surgery in July 2025.  She denies any pain but reports it feels like a zap.  Sometimes she feels a light in front of her eyes.  She has not had an eye examination in over 2 years.  She has prescription eyeglasses.  She has not had any loss of consciousness or one-sided weakness or numbness.  She reports that she has intermittent muscle twitching for which she was on clonazepam  by another provider.  I reviewed your telemedicine note from 09/05/2023.  Her blood pressure has been elevated.  She was started on losartan in August 2025.  She has been on metoprolol  and clonidine as well.  Her diabetes is suboptimally controlled with an A1c of 13.8.  She has been on Lantus.  Of note, she has been on clonazepam  for anxiety.  You ordered a brain MRI.  She has not had the brain MRI yet.  She reports that insurance denied the MRI of the brain.  She is wondering if she needs an EEG.  She denies any convulsion.  She does not drink caffeine, she drinks no alcohol, she does not smoke cigarettes but vapes daily.  She snores.  She has not had a sleep study.  She denies morning headaches.  She has nocturia about once per average night.  Her Epworth sleepiness score is 6 out of  24 today.  She has seen a neurologist, prior neurology records are not available for my review today.  I had evaluated her over 10 years ago for a tremor.  She is followed by orthopedics for her right ulnar neuropathy and ongoing pain.  She had EMG and nerve conduction velocity testing through orthopedics under Dr. Albina.    Previously:  09/08/2013: 43 year old right-handed woman with an underlying medical history of thyroid  disease, anxiety and chronic knee pain, who reports tremors in her lower extremities as well as abnormal jerking in her lower extremities. Symptoms started in December after a fall onto concrete. She has been seen multiple times in the emergency room for pain. She had x-rays to her left knee, right hip, lumbar spine and had CTH wo contrast, all of which were reported as unremarkable. Pain is reported in her lower back as well as both knees. On 09/04/2013 she saw Dr. Carilyn for her knee and back issues. She was started on long-acting propranolol  60 mg daily for her tremor control symptomatically. She reports, that it made it worse and she stopped taking it. She had tingling with it. She ran out of clonazepam . She sleeps poorly. She had taken oxycodone , but has finished those as well.  She was at [facility] for 3 days in December and has been to the ER multiple times. She had seen psychiatry in the hospital in 4/15 an outpatient referral to psychiatry for recommended.  Her Past Medical History Is Significant For: Past Medical History:  Diagnosis Date   Anxiety    Bilateral chronic knee pain    Thyroid  disease     Her Past Surgical History Is Significant For: Past Surgical History:  Procedure Laterality Date   THYROIDECTOMY  2007    Her Family History Is Significant For: Family History  Problem Relation Age of Onset   Cancer Father    Diabetes Father    Hypertension Father    Diabetes Other     Her Social History Is Significant For: Social History   Socioeconomic  History   Marital status: Married    Spouse name: Not on file   Number of children: 0   Years of education: BS/assoc.   Highest education level: Not on file  Occupational History    Comment: unemployed  Tobacco Use   Smoking status: Former   Smokeless tobacco: Never   Tobacco comments:    electronic cigs.since 03/2012  Vaping Use   Vaping status: Every Day  Substance and Sexual Activity   Alcohol use: Not Currently    Comment: occas.   Drug use: No   Sexual activity: Not on file  Other Topics Concern   Not on file  Social History Narrative   Patient is right handed,    Lives alone    Not working    Social Drivers of Health   Financial Resource Strain: Not on file  Food Insecurity: Food Insecurity Present (04/14/2020)   Received from [facility]   Hunger Vital Sign    Within the past 12 months, you worried that your food would run out before you got the money to buy more.: Sometimes true    Within the past 12 months, the food you bought just didn't last and you didn't have money to get more.: Sometimes true  Transportation Needs: Not on file  Physical Activity: Not on file  Stress: Not on file  Social Connections: Unknown (05/27/2021)   Received from [facility]   Social Network    Social Network: Not on file    Her Allergies Are:  Allergies  Allergen Reactions   Naproxen      States takes my hair out   :   Her Current Medications Are:  Outpatient Encounter Medications as of 10/23/2023  Medication Sig   clonazePAM  (KLONOPIN ) 1 MG tablet Take 1 tablet (1 mg total) by mouth 2 (two) times daily as needed (spasms). (Patient taking differently: Take 1 mg by mouth 3 (three) times daily as needed (spasms).)   gabapentin (NEURONTIN) 300 MG capsule Take by mouth.   labetalol (NORMODYNE) 200 MG tablet Take 200 mg by mouth 3 (three) times daily.   LANTUS SOLOSTAR 100 UNIT/ML Solostar Pen SMARTSIG:12 Unit(s) SUB-Q Daily   losartan (COZAAR) 100 MG tablet Take 100 mg by  mouth daily.   pantoprazole  (PROTONIX ) 20 MG tablet Take 1 tablet (20 mg total) by mouth daily.   rosuvastatin (CRESTOR) 10 MG tablet Take 10 mg by mouth daily.   atorvastatin  (LIPITOR) 40 MG tablet Take 1 tablet (40 mg total) by mouth daily.   cephALEXin  (KEFLEX ) 500 MG capsule Take 1 capsule (500 mg total) by mouth 4 (four) times daily.   diclofenac  Sodium (VOLTAREN ) 1 % GEL Apply 2 g topically as needed (Knee pain).   metoCLOPramide  (REGLAN ) 10 MG tablet Take 1 tablet (10 mg total) by mouth 3 (three) times daily with meals. (Patient not taking: Reported on 01/29/2021)   naproxen  (NAPROSYN )  375 MG tablet Take 1 tablet (375 mg total) by mouth 2 (two) times daily.   ondansetron  (ZOFRAN ) 4 MG tablet Take 1 tablet (4 mg total) by mouth every 6 (six) hours as needed for nausea. (Patient not taking: Reported on 01/29/2021)   promethazine  (PHENERGAN ) 25 MG suppository Place 1 suppository (25 mg total) rectally every 6 (six) hours as needed for nausea or vomiting.   propranolol  ER (INDERAL  LA) 60 MG 24 hr capsule Take 1 capsule (60 mg total) by mouth daily. (Patient not taking: Reported on 01/18/2014)   No facility-administered encounter medications on file as of 10/23/2023.  :   Review of Systems:  Out of a complete 14 point review of systems, all are reviewed and negative with the exception of these symptoms as listed below:   Review of Systems  Neurological:        Pt here for headaches  Pt states feels like lighting in her head. Pt had surgery on right elbow for severe neuropathy (ul[place]) . Pt states after surgery is when episodes started . Pt states surgery 07/2023   ESS:6     Objective:  Neurological Exam  Physical Exam Physical Examination:   Vitals:   10/23/23 1513  BP: 121/78  Pulse: 78    General Examination: The patient is a very pleasant 42 y.o. female in no acute distress. She appears well-developed and well-nourished and well groomed.   HEENT: Normocephalic, atraumatic,  pupils are equal, round and reactive to light, corrective eyeglasses in place.  Extraocular tracking is good without limitation to gaze excursion or nystagmus noted. Hearing is grossly intact. Face is symmetric with normal facial animation. Speech is clear with no dysarthria noted. There is no hypophonia. There is no lip, neck/head, jaw or voice tremor. Neck is supple with full range of passive and active motion. There are no carotid bruits on auscultation. Oropharynx exam reveals: moderate mouth dryness, moderate airway crowding.  Tongue protrudes centrally and palate elevates symmetrically.   Chest: Clear to auscultation without wheezing, rhonchi or crackles noted.  Heart: S1+S2+0, regular and normal without murmurs, rubs or gallops noted.   Abdomen: Soft, non-tender and non-distended.  Extremities: There is no pitting edema in the distal lower extremities bilaterally.   Skin: Warm and dry without trophic changes noted.   Musculoskeletal: exam reveals right elbow in the splint and arm in a sling, reports left knee pain, cannot tolerate reflex testing.     Neurologically:  Mental status: The patient is awake, alert and oriented in all 4 spheres. Her immediate and remote memory, attention, language skills and fund of knowledge are appropriate. There is no evidence of aphasia, agnosia, apraxia or anomia. Speech is clear with normal prosody and enunciation. Thought process is linear. Mood is normal and affect is normal.  Cranial nerves II - XII are as described above under HEENT exam.  Motor exam: Normal bulk, strength and tone is noted in the left upper extremity and right lower extremity. There is no obvious action or resting tremor.  Limited range of motion and pain with testing in the left knee.  She also has limited range of motion in the right hand, right elbow in a sling and splint. Limited reflex testing shows normal reflexes in the left upper extremity and right knee and ankle as well as  left ankle but request not to be tested in the left knee as she has pain. Fine motor skills and coordination: grossly intact with limited exam possible in the right  hand.  Cerebellar testing: No dysmetria or intention tremor. There is no truncal or gait ataxia.  Finger-to-nose normal on the left, normal heel-to-shin on the right, reports pain with the left leg. Sensory exam: intact to light touch in the upper and lower extremities.  Gait, station and balance: She stands slowly and reports that she needs a wheelchair.  She brought a cane.  She reports that she has a walker but did not bring it.  She walks slowly with a single-point cane and we provided a wheelchair with assistance to go to checkout.  She reports that she came via [place].  Assessment and Plan:  In summary, Regina Robertson is a very pleasant 43 y.o.-year old female with an underlying medical history of thyroid  disease, anxiety, knee pain, hypertension, hyperlipidemia, vitamin D deficiency, history of ulnar neuropathy with status post surgery, diabetes, and obesity, who presents for evaluation of abnormal sensations that she has been experiencing intermittently in her head since July 2025.  History and examination are not in keeping with migraine headaches or tension headaches.  She denies any actual pain.  While her history and examination are not supportive of seizure disorder, she is inquiring about getting an EEG.  I would be happy to order an EEG.  She is largely reassured, neurological exam is largely nonfocal with the exception of pain in her left knee which is chronic and pain in her right elbow which is also chronic.   She may be at risk for obstructive sleep apnea based on her exam.  She is willing to proceed with a sleep study.   She reports that her brain MRI was denied by her insurance and I am happy to reorder it to see if it would be approved through our clinic. We will keep her posted as to her test results by phone call.  She  was given detailed instructions and is advised to talk to you about her anxiety management as she was inquiring about getting a prescription for clonazepam .  I advised her that I do not treat anxiety and I would not be able to prescribe the clonazepam  for her.  She reports that she has had tremors and the clonazepam  helps with that.  She does not have any obvious tremors on exam today and no muscle twitching was noted.  For her chronic knee pain she is advised to talk to about a referral to orthopedics.    Below is a summary of my recommendations and our discussion points from today's visit, based on chart review, history and examination. They were given these instructions verbally during the visit in detail and also in writing in the MyChart after visit summary (AVS), which they can access electronically.   << Please remember, common headache triggers are: sleep deprivation, dehydration, overheating, stress, hypoglycemia or skipping meals and blood sugar fluctuations, excessive pain medications or excessive alcohol use or caffeine withdrawal. Some people have food triggers such as aged cheese, orange juice or chocolate, especially dark chocolate, or MSG (monosodium glutamate). Try to avoid these headache triggers as much possible. It may be helpful to keep a headache diary to figure out what makes your headaches worse or brings them on and what alleviates them. Some people report headache onset after exercise but studies have shown that regular exercise may actually prevent headaches from coming. If you have exercise-induced headaches, please make sure that you drink plenty of fluid before and after exercising and that you do not over do it and do not  overheat. Please talk to your primary care about your chronic knee pain on the left side, you may benefit from seeing an orthopedic doctor for this. I recommend you get a full, dilated eye examination as you have not seen an eye doctor in over 2 years.  Strain  on the eyes can cause headaches.   I will order a brain scan, called MRI and call you with the test results. We will have to schedule you for this on a separate date. This test requires authorization from your insurance, and we will take care of the insurance process. I will order a sleep study to look for signs of obstructive sleep apnea (aka OSA). As explained, the long-term risks and ramifications of untreated moderate to severe obstructive sleep apnea may include (but are not limited to): increased risk for cardiovascular disease, including congestive heart failure, stroke, difficult to control hypertension, treatment resistant obesity, arrhythmias, especially irregular heartbeat commonly known as A. Fib. (atrial fibrillation); even type 2 diabetes has been linked to untreated OSA.  We will keep you posted as to your test results by phone call for now.  We will plan to follow-up accordingly.   Please talk to your primary care about your anxiety medication called clonazepam  and whether or not she recommends you continue with it.  As discussed, I will not be able to prescribe the clonazepam  for you. We will do an EEG (brainwave test), which we will schedule. We will call you with the results.  >>    Thank you very much for allowing me to participate in the care of this nice patient. If I can be of any further assistance to you please do not hesitate to call me at [phone].  Sincerely,   True Mar, MD, PhD

## 2023-10-23 NOTE — Addendum Note (Signed)
 Addended by: Camiah Humm on: 10/23/2023 04:48 PM   Modules accepted: Orders

## 2023-10-24 ENCOUNTER — Telehealth: Payer: Self-pay | Admitting: Neurology

## 2023-10-24 NOTE — Telephone Encounter (Signed)
 NPSG M[facility] [place] pending

## 2023-10-28 ENCOUNTER — Telehealth: Payer: Self-pay | Admitting: Neurology

## 2023-10-28 NOTE — Telephone Encounter (Signed)
 Jarrell barrows: [id number] exp. 10/24/23-12/23/23 sent to [facility] [id number]

## 2023-11-04 NOTE — Telephone Encounter (Signed)
 NPSG MCD Jarrell barrows: [id number] (exp. 10/24/23 to 01/21/24)

## 2023-11-05 ENCOUNTER — Other Ambulatory Visit: Payer: MEDICAID | Admitting: *Deleted

## 2023-11-14 ENCOUNTER — Other Ambulatory Visit: Payer: MEDICAID | Admitting: *Deleted

## 2023-11-18 NOTE — Telephone Encounter (Signed)
 unable to leave vmail the phone kept ringing

## 2023-11-26 ENCOUNTER — Other Ambulatory Visit: Payer: MEDICAID | Admitting: *Deleted
